# Patient Record
Sex: Female | Born: 1985
Health system: Southern US, Community
[De-identification: ages and names within clinical notes are randomized; demographics above are authoritative.]

## PROBLEM LIST (undated history)

## (undated) DIAGNOSIS — F32A Depression, unspecified: Secondary | ICD-10-CM

## (undated) DIAGNOSIS — N6029 Fibroadenosis of unspecified breast: Secondary | ICD-10-CM

## (undated) DIAGNOSIS — D649 Anemia, unspecified: Secondary | ICD-10-CM

## (undated) DIAGNOSIS — Z8489 Family history of other specified conditions: Secondary | ICD-10-CM

## (undated) DIAGNOSIS — L501 Idiopathic urticaria: Secondary | ICD-10-CM

## (undated) DIAGNOSIS — R030 Elevated blood-pressure reading, without diagnosis of hypertension: Secondary | ICD-10-CM

## (undated) DIAGNOSIS — L509 Urticaria, unspecified: Secondary | ICD-10-CM

## (undated) DIAGNOSIS — F99 Mental disorder, not otherwise specified: Secondary | ICD-10-CM

## (undated) DIAGNOSIS — I10 Essential (primary) hypertension: Secondary | ICD-10-CM

## (undated) HISTORY — PX: NO PAST SURGERIES: SHX2092

## (undated) HISTORY — DX: Mental disorder, not otherwise specified: F99

## (undated) HISTORY — DX: Anemia, unspecified: D64.9

---
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
## 2018-07-31 ENCOUNTER — Other Ambulatory Visit: Payer: Self-pay | Admitting: Family Medicine

## 2018-07-31 DIAGNOSIS — N649 Disorder of breast, unspecified: Secondary | ICD-10-CM | POA: Insufficient documentation

## 2018-07-31 DIAGNOSIS — N6320 Unspecified lump in the left breast, unspecified quadrant: Secondary | ICD-10-CM

## 2018-07-31 HISTORY — DX: Disorder of breast, unspecified: N64.9

## 2018-07-31 LAB — HM PAP SMEAR: HM Pap smear: NEGATIVE

## 2018-07-31 LAB — HM HIV SCREENING LAB: HM HIV Screening: NEGATIVE

## 2019-07-01 DIAGNOSIS — N649 Disorder of breast, unspecified: Secondary | ICD-10-CM

## 2019-07-02 ENCOUNTER — Ambulatory Visit: Payer: Self-pay

## 2019-09-15 ENCOUNTER — Ambulatory Visit: Payer: Medicaid Other

## 2019-10-05 ENCOUNTER — Ambulatory Visit: Payer: Medicaid Other

## 2019-10-21 ENCOUNTER — Ambulatory Visit: Payer: Self-pay | Admitting: Physician Assistant

## 2019-10-21 ENCOUNTER — Other Ambulatory Visit: Payer: Self-pay

## 2019-10-21 DIAGNOSIS — B9689 Other specified bacterial agents as the cause of diseases classified elsewhere: Secondary | ICD-10-CM

## 2019-10-21 DIAGNOSIS — Z113 Encounter for screening for infections with a predominantly sexual mode of transmission: Secondary | ICD-10-CM

## 2019-10-21 DIAGNOSIS — N760 Acute vaginitis: Secondary | ICD-10-CM

## 2019-10-21 LAB — WET PREP FOR TRICH, YEAST, CLUE
Trichomonas Exam: NEGATIVE
Yeast Exam: NEGATIVE

## 2019-10-21 MED ORDER — METRONIDAZOLE 500 MG PO TABS: 2000.0000 mg | ORAL_TABLET | Freq: Once | ORAL | 0 refills | Status: AC

## 2019-10-22 ENCOUNTER — Encounter: Payer: Self-pay | Admitting: Physician Assistant

## 2019-10-22 NOTE — Progress Notes (Signed)
[facility] Department STI clinic/screening visit  Subjective:  Melody Hall is a 34 y.o. female being seen today for an STI screening visit. The patient reports they do have symptoms.  Patient reports that they do not desire a pregnancy in the next year.   They reported they are not interested in discussing contraception today.  No LMP recorded.   Patient has the following medical conditions:   Patient Active Problem List   Diagnosis Date Noted  . Breast lesion 07/31/2018    Chief Complaint  Patient presents with  . SEXUALLY TRANSMITTED DISEASE    HPI  Patient reports that she has noticed a slight odor this morning.  Denies other symptoms.  Using condoms as BCM.  LMP was 09/29/2019 and normal.  See flowsheet for further details and programmatic requirements.    The following portions of the patient's history were reviewed and updated as appropriate: allergies, current medications, past medical history, past social history, past surgical history and problem list.  Objective:  There were no vitals filed for this visit.  Physical Exam Constitutional:      General: She is not in acute distress.    Appearance: Normal appearance.  HENT:     Head: Normocephalic and atraumatic.     Comments: No nits, lice, or hair loss. No cervical, supraclavicular or axillary adenopathy.    Mouth/Throat:     Mouth: Mucous membranes are moist.     Pharynx: Oropharynx is clear. No oropharyngeal exudate or posterior oropharyngeal erythema.  Eyes:     Conjunctiva/sclera: Conjunctivae normal.  Pulmonary:     Effort: Pulmonary effort is normal.  Abdominal:     Palpations: Abdomen is soft. There is no mass.     Tenderness: There is no abdominal tenderness. There is no guarding or rebound.  Genitourinary:    General: Normal vulva.     Rectum: Normal.     Comments: External genitalia/pubic area without nits, lice, edema, erythema, lesions and inguinal adenopathy. Vagina with normal  mucosa and discharge, pH=4.5. Cervix without visible lesions. Uterus firm, mobile, nt, no masses, no CMT, no adnexal tenderness or fullness. Musculoskeletal:     Cervical back: Neck supple. No tenderness.  Skin:    General: Skin is warm and dry.     Findings: No bruising, erythema, lesion or rash.  Neurological:     Mental Status: She is alert and oriented to person, place, and time.  Psychiatric:        Mood and Affect: Mood normal.        Behavior: Behavior normal.        Thought Content: Thought content normal.        Judgment: Judgment normal.      Assessment and Plan:  Melody Hall is a 34 y.o. female presenting to the [facility] Department for STI screening  1. Screening for STD (sexually transmitted disease) Patient into clinic with symptoms. Rec condoms with all sex. Await test results.  Counseled that RN will call if needs to RTC for further treatment once results are back. - WET PREP FOR TRICH, YEAST, CLUE - Gonococcus culture - Chlamydia/Gonorrhea [place] [facility] - HIV [place] [facility] - Syphilis Serology,  Lab  2. BV (bacterial vaginosis) Patient counseled re:  tx for BV but requests alternative treatment that "does not take so long". Will treat with Metronidazole 2 g po with food, no EtOH for 24 hr before and until 72 hr after taking medicine. No sex for 7  days. Rec using OTC antifungal cream if has itching after treatment with antibiotics. - metroNIDAZOLE (FLAGYL) 500 MG tablet; Take 4 tablets (2,000 mg total) by mouth once for 1 dose.  Dispense: 4 tablet; Refill: 0     No follow-ups on file.  No future appointments.  Jerene Dilling, PA

## 2019-10-24 NOTE — Progress Notes (Signed)
Chart reviewed by Pharmacist  Suzanne Walker PharmD, Contract Pharmacist at [facility]  

## 2019-10-25 LAB — GONOCOCCUS CULTURE

## 2019-12-10 ENCOUNTER — Other Ambulatory Visit: Payer: Self-pay

## 2019-12-10 ENCOUNTER — Ambulatory Visit: Payer: Medicaid Other

## 2019-12-14 ENCOUNTER — Other Ambulatory Visit: Payer: Medicaid Other

## 2019-12-31 ENCOUNTER — Ambulatory Visit: Payer: Medicaid Other

## 2020-04-20 ENCOUNTER — Ambulatory Visit (LOCAL_COMMUNITY_HEALTH_CENTER): Payer: Medicaid Other | Admitting: Physician Assistant

## 2020-04-20 ENCOUNTER — Other Ambulatory Visit: Payer: Self-pay

## 2020-04-20 ENCOUNTER — Encounter: Payer: Self-pay | Admitting: Physician Assistant

## 2020-04-20 VITALS — BP 111/79 | Ht 64.0 in | Wt 194.4 lb

## 2020-04-20 DIAGNOSIS — Z1331 Encounter for screening for depression: Secondary | ICD-10-CM | POA: Diagnosis not present

## 2020-04-20 DIAGNOSIS — Z3009 Encounter for other general counseling and advice on contraception: Secondary | ICD-10-CM | POA: Diagnosis not present

## 2020-04-20 DIAGNOSIS — E669 Obesity, unspecified: Secondary | ICD-10-CM | POA: Diagnosis not present

## 2020-04-20 LAB — WET PREP FOR TRICH, YEAST, CLUE
Trichomonas Exam: NEGATIVE
Yeast Exam: NEGATIVE

## 2020-04-20 NOTE — Progress Notes (Signed)
[facility] DEPARTMENT [facility] [address] Main Number: [phone]    Family Planning Visit- Initial Visit  Subjective:  Melody Hall is a 34 y.o.  [id number]   being seen today for an initial well woman visit and to discuss family planning options.  She is currently using condoms, though states did not use with last sex on 04/15/20 for pregnancy prevention. Patient reports she does not want a pregnancy in the next year.  Patient has the following medical conditions has Breast lesion; Obesity without serious comorbidity; and Positive depression screening on their problem list.  Chief Complaint  Patient presents with  . Annual Exam  . Contraception    Patient reports LMP 04/06/20.   Body mass index is 33.37 kg/m. - Patient is eligible for diabetes screening based on BMI and age >54?  no HA1C ordered? not applicable  Patient reports 1 of partners in last year. Desires STI screening?  Yes  Has patient been screened once for HCV in the past?  No  No results found for: HCVAB  Does the patient have current drug use (including MJ), have a partner with drug use, and/or has been incarcerated since last result? No  If yes-- Screen for HCV through [facility]   Does the patient meet criteria for HBV testing? No  Criteria:  -Household, sexual or needle sharing contact with HBV -History of drug use -HIV positive -Those with known Hep C   Health Maintenance Due  Topic Date Due  . Hepatitis C Screening  Never done  . INFLUENZA VACCINE  02/13/2020    Review of Systems  Constitutional: Negative.   HENT: Negative.   Eyes: Negative.   Respiratory: Negative.   Cardiovascular: Negative.   Gastrointestinal: Negative.   Genitourinary: Negative.   Musculoskeletal: Negative.   Skin: Negative.   Neurological: Negative.   Endo/Heme/Allergies: Negative.   Psychiatric/Behavioral: Positive for depression and suicidal ideas. The patient is  nervous/anxious.   Denies current SI/HI, no HI ever. SI "sometimes." Does not have plan, except "take some pills."   The following portions of the patient's history were reviewed and updated as appropriate: allergies, current medications, past family history, past medical history, past social history, past surgical history and problem list. Problem list updated.   See flowsheet for other program required questions.  Objective:   Vitals:   04/20/20 0942  BP: 111/79  Weight: 194 lb 6.4 oz (88.2 kg)  Height: 5\' 4"  (1.626 m)    Physical Exam Exam conducted with a chaperone present.  Constitutional:      Appearance: She is obese.  Cardiovascular:     Rate and Rhythm: Normal rate and regular rhythm.     Pulses: Normal pulses.     Heart sounds: Normal heart sounds.  Pulmonary:     Effort: Pulmonary effort is normal.     Breath sounds: Normal breath sounds.  Genitourinary:    General: Normal vulva.     Exam position: Lithotomy position.     Labia:        Right: No lesion.        Left: No lesion.      Vagina: Vaginal discharge present.     Cervix: No cervical motion tenderness, friability, lesion or erythema.     Uterus: Normal.      Adnexa: Right adnexa normal and left adnexa normal.     Rectum: No external hemorrhoid.     Comments: Vag pH <4.5, mod amt white  creamy discharge without odor Musculoskeletal:        General: Normal range of motion.     Cervical back: Normal range of motion.  Lymphadenopathy:     Cervical: No cervical adenopathy.     Lower Body: No right inguinal adenopathy. No left inguinal adenopathy.  Skin:    General: Skin is warm and dry.  Neurological:     General: No focal deficit present.     Mental Status: She is alert and oriented to person, place, and time.  Psychiatric:        Behavior: Behavior normal.        Thought Content: Thought content normal.        Judgment: Judgment normal.       Assessment and Plan:  Melody Hall is a 34 y.o.  female presenting to the [facility] Department for an initial well woman exam/family planning visit  Contraception counseling: Reviewed all forms of birth control options in the tiered based approach. available including abstinence; over the counter/barrier methods; hormonal contraceptive medication including pill, patch, ring, injection,contraceptive implant, ECP; hormonal and nonhormonal IUDs; permanent sterilization options including vasectomy and the various tubal sterilization modalities. Risks, benefits, and typical effectiveness rates were reviewed.  Questions were answered.  Written information was also given to the patient to review.  Patient desires DMPA, this was not yet prescribed for patient. She will follow up in about 10 days for UPT, and if neg will start DMPA.  She was told to call with any further questions, or with any concerns about this method of contraception.  Emphasized use of condoms 100% of the time for STI prevention.   1. Family planning services Due to recent UPI, rec sexual abstinance til f/u in about 10 days, and then have UPT. If UPT neg then, start DMPA 150 mg IM and repeat every 3 mo for 1 year. Wet prep neg today. Next cervical cancer screen due 08/01/23. - WET PREP FOR TRICH, YEAST, CLUE - HIV [place] [facility] - Chlamydia/Gonorrhea [place] [facility] - Gonococcus culture - Syphilis Serology, [place] [facility]  2. Positive depression screening PHQ-9 = 23 today. Refer for counseling at pt request.  3. Obesity without serious comorbidity, unspecified classification, unspecified obesity type      Return in about 10 days (around 04/30/2020) for UPT, and if neg, DMPA injection.  No future appointments.  Landry Dyke, PA-C

## 2020-04-20 NOTE — Progress Notes (Signed)
Here today for PE and birth control. Last Pap Smear and PE here was 07/31/2018.  Wants all STD screening today. Interested in Depo for birth control. PHQ9 completed and entered in Epic. Tawny Hopping, RN

## 2020-04-25 LAB — GONOCOCCUS CULTURE

## 2020-07-22 ENCOUNTER — Other Ambulatory Visit: Payer: Self-pay

## 2020-07-22 DIAGNOSIS — Z20822 Contact with and (suspected) exposure to covid-19: Secondary | ICD-10-CM

## 2020-07-26 LAB — NOVEL CORONAVIRUS, NAA: SARS-CoV-2, NAA: NOT DETECTED

## 2020-07-31 ENCOUNTER — Encounter: Payer: Self-pay | Admitting: Physician Assistant

## 2020-10-17 ENCOUNTER — Telehealth: Payer: Self-pay | Admitting: Licensed Clinical Social Worker

## 2020-10-17 NOTE — Telephone Encounter (Signed)
Patient left vm requesting a return call.

## 2021-01-08 ENCOUNTER — Ambulatory Visit: Payer: Medicaid Other

## 2021-03-12 ENCOUNTER — Other Ambulatory Visit: Payer: Self-pay

## 2021-03-12 ENCOUNTER — Emergency Department (HOSPITAL_COMMUNITY)
Admission: EM | Admit: 2021-03-12 | Discharge: 2021-03-13 | Disposition: A | Discharge status: Home / Self Care | Payer: BC Managed Care – PPO | Attending: Emergency Medicine | Admitting: Emergency Medicine

## 2021-03-12 ENCOUNTER — Encounter (HOSPITAL_COMMUNITY): Payer: Self-pay

## 2021-03-12 ENCOUNTER — Emergency Department (HOSPITAL_COMMUNITY): Payer: BC Managed Care – PPO

## 2021-03-12 DIAGNOSIS — M791 Myalgia, unspecified site: Secondary | ICD-10-CM | POA: Diagnosis not present

## 2021-03-12 DIAGNOSIS — S4991XA Unspecified injury of right shoulder and upper arm, initial encounter: Secondary | ICD-10-CM

## 2021-03-12 DIAGNOSIS — S40022A Contusion of left upper arm, initial encounter: Secondary | ICD-10-CM | POA: Diagnosis not present

## 2021-03-12 DIAGNOSIS — S40021A Contusion of right upper arm, initial encounter: Secondary | ICD-10-CM | POA: Diagnosis not present

## 2021-03-12 DIAGNOSIS — S4992XA Unspecified injury of left shoulder and upper arm, initial encounter: Secondary | ICD-10-CM
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
## 2021-03-13 MED ORDER — OXYCODONE-ACETAMINOPHEN 5-325 MG PO TABS: 1.0000 | ORAL_TABLET | ORAL | 0 refills | Status: DC | PRN

## 2021-03-13 MED ORDER — OXYCODONE-ACETAMINOPHEN 5-325 MG PO TABS
1.0000 | ORAL_TABLET | Freq: Once | ORAL | Status: AC
  Administered 2021-03-13: 1 via ORAL
  Filled 2021-03-13: qty 1

## 2021-03-13 NOTE — Discharge Instructions (Addendum)
Use alternating warm and cool compresses 3-4 times a day. Continue your Naproxen and take Percocet as prescribed as needed for pain.   Return to the ED with any new or concerning symptoms at any time.
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
## 2021-05-01 ENCOUNTER — Ambulatory Visit: Payer: BC Managed Care – PPO

## 2021-09-07 ENCOUNTER — Ambulatory Visit: Payer: BC Managed Care – PPO

## 2021-09-27 ENCOUNTER — Ambulatory Visit: Payer: BC Managed Care – PPO

## 2021-10-03 ENCOUNTER — Ambulatory Visit (LOCAL_COMMUNITY_HEALTH_CENTER): Payer: BC Managed Care – PPO | Admitting: Family Medicine

## 2021-10-03 ENCOUNTER — Encounter: Payer: Self-pay | Admitting: Family Medicine

## 2021-10-03 ENCOUNTER — Other Ambulatory Visit: Payer: Self-pay

## 2021-10-03 VITALS — BP 134/88 | Ht 64.0 in | Wt 181.6 lb

## 2021-10-03 DIAGNOSIS — Z3200 Encounter for pregnancy test, result unknown: Secondary | ICD-10-CM

## 2021-10-03 DIAGNOSIS — Z3009 Encounter for other general counseling and advice on contraception: Secondary | ICD-10-CM | POA: Diagnosis not present

## 2021-10-03 DIAGNOSIS — Z332 Encounter for elective termination of pregnancy: Secondary | ICD-10-CM

## 2021-10-03 DIAGNOSIS — Z113 Encounter for screening for infections with a predominantly sexual mode of transmission: Secondary | ICD-10-CM

## 2021-10-03 DIAGNOSIS — Z3201 Encounter for pregnancy test, result positive: Secondary | ICD-10-CM

## 2021-10-03 LAB — HM HIV SCREENING LAB: HM HIV Screening: NEGATIVE

## 2021-10-03 LAB — WET PREP FOR TRICH, YEAST, CLUE
Trichomonas Exam: NEGATIVE
Yeast Exam: NEGATIVE

## 2021-10-03 LAB — PREGNANCY, URINE: Preg Test, Ur: POSITIVE — AB

## 2021-10-03 NOTE — Progress Notes (Signed)
? ?  [place] problem visit  ?[place] Department ? ?Subjective:  ?Melody Hall is a 36 y.o. being seen today for  ? ?Chief Complaint  ?Patient presents with  ? Contraception  ?  PE and Depo  ? ? ?HPI ? ? ?Does the patient have a current or past history of drug use? Yes  ? No components found for: HCV] ? ? ?Health Maintenance Due  ?Topic Date Due  ? Hepatitis C Screening  Never done  ? COVID-19 Vaccine (3 - Booster for Moderna series) 03/15/2020  ? INFLUENZA VACCINE  02/12/2021  ? ? ?ROS ? ?The following portions of the patient's history were reviewed and updated as appropriate: allergies, current medications, past family history, past medical history, past social history, past surgical history and problem list. Problem list updated. ? ? ?See flowsheet for other program required questions. ? ?Objective:  ? ?Vitals:  ? 10/03/21 1436  ?BP: 134/88  ?Weight: 181 lb 9.6 oz (82.4 kg)  ?Height: 5\' 4"  (1.626 m)  ? ? ?Physical Exam ?Constitutional:   ?   Appearance: Normal appearance.  ?Skin: ?   General: Skin is warm and dry.  ?Neurological:  ?   Mental Status: She is alert and oriented to person, place, and time.  ?Psychiatric:     ?   Mood and Affect: Mood normal.     ?   Behavior: Behavior normal.  ? ? ? ? ?Assessment and Plan:  ?Melody Hall is a 36 y.o. female presenting to the [facility] Department for a Women's Health problem visit ? ?1. Screening examination for venereal disease ?Patient accepted all screenings including wet prep, vaginal CT/GC and bloodwork for HIV/RPR.  ?Patient meets criteria for HepB screening? Yes. Ordered? No - declined  ?Patient meets criteria for HepC screening? Yes. Ordered? No - declined  ? ?Wet prep results neg    ?Treatment needed  ?Discussed time line for [facility] Lab results and that patient will be called with positive results and encouraged patient to call if she had not heard in 2 weeks.  ?Counseled to return or seek care for continued  or worsening symptoms ?Recommended condom use with all sex ? ?Patient is currently using  no BCM  to prevent pregnancy.   ?- Chlamydia/Gonorrhea [place] [facility] ?- HIV [place] [facility] ?- WET PREP FOR TRICH, YEAST, CLUE ?- Syphilis Serology, [place] [facility] ? ?2. Encounter for pregnancy test, result unknown ?PT positive  ?- Pregnancy, urine ? ?3. Termination of pregnancy (fetus) ?Pt reports termination of pregnancy on 08/27/2021, PT today still showing positive, beta Hcg for levels. Will contact pt with results and follow up steps  ?- Beta hCG quant (ref lab) ? ? ? ? ?No follow-ups on file. ? ?No future appointments. ? ?Junious Dresser, FNP ? ?

## 2021-10-03 NOTE — Progress Notes (Signed)
Pt here for PE and Depo.  Patient notified of positive pregnancy test.  Beta hCG ordered.  Provider will notify pt of results and determine plan of care, at that time.  Windle Guard, RN ? ?

## 2021-10-04 LAB — BETA HCG QUANT (REF LAB): hCG Quant: 60 m[IU]/mL
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
## 2021-10-25 ENCOUNTER — Ambulatory Visit: Payer: BC Managed Care – PPO

## 2021-10-26 ENCOUNTER — Encounter: Payer: Self-pay | Admitting: Advanced Practice Midwife

## 2021-10-26 ENCOUNTER — Ambulatory Visit (LOCAL_COMMUNITY_HEALTH_CENTER): Payer: BC Managed Care – PPO | Admitting: Advanced Practice Midwife

## 2021-10-26 VITALS — BP 128/87 | HR 78 | Temp 97.1°F | Ht 64.0 in | Wt 186.6 lb

## 2021-10-26 DIAGNOSIS — Z30013 Encounter for initial prescription of injectable contraceptive: Secondary | ICD-10-CM

## 2021-10-26 DIAGNOSIS — Z3202 Encounter for pregnancy test, result negative: Secondary | ICD-10-CM

## 2021-10-26 DIAGNOSIS — Z3009 Encounter for other general counseling and advice on contraception: Secondary | ICD-10-CM | POA: Diagnosis not present

## 2021-10-26 LAB — PREGNANCY, URINE: Preg Test, Ur: NEGATIVE

## 2021-10-26 MED ORDER — MEDROXYPROGESTERONE ACETATE 150 MG/ML IM SUSP
150.0000 mg | Freq: Once | INTRAMUSCULAR | Status: AC
  Administered 2021-10-26: 150 mg via INTRAMUSCULAR

## 2021-10-26 NOTE — Progress Notes (Signed)
Provider aware of urine pregnancy result prior to Dep injection. Alesia Banda RN ?

## 2021-10-26 NOTE — Progress Notes (Signed)
Patient her for repeat pregnancy testing and to discuss BCM. Consented for Depo Provera and provider viewed in house result of negative urine pregnancy test. Counseling given, patient instructed to schedule appointment for Physical Exam in 1 week and  no sexual intercourse for 7 days. Patient verbalized understanding. Alesia Banda RN ?

## 2021-10-26 NOTE — Progress Notes (Signed)
[facility] DEPARTMENT ?Family Planning Clinic ?[address] [employer] ?Main Number: [phone] ? ?Contraception/Family Planning VISIT ENCOUNTER NOTE ? ?Subjective:  ? Melody Hall is a 36 y.o. SBF [id number] female here for reproductive life counseling. The patient is currently using No Method - Other Reason to prevent pregnancy.  Pt came in 10/03/21 for an STD screen and wanting DMPA initiation. EAB 08/27/21 and at 10/03/21 apt had +PT and Bhcg=60.  Last sex 10/12/21 without condom; with current partner x 3 years; 1 partner in last 3 mo. Wants DMPA initiation today. Last PE 04/20/20. No menses since EAB. Last pap 07/31/18 neg HPV neg ?Denies cigs, vaping, cigars. Last MJ last night. Last ETOH last night (2 glasses wine) 2x/wk ? ?The patient does not want a pregnancy in the next year.   ? ?Client states they are looking for the following:  High efficacy at preventing pregnancy ? ?Denies abnormal vaginal bleeding, discharge, pelvic pain, problems with intercourse or other gynecologic concerns.  ?  ?Gynecologic History ?Patient's last menstrual period was 06/27/2021 (approximate). ? ?Health Maintenance Due  ?Topic Date Due  ? Hepatitis C Screening  Never done  ? COVID-19 Vaccine (3 - Booster for Moderna series) 03/15/2020  ? ? ? ?The following portions of the patient's history were reviewed and updated as appropriate: allergies, current medications, past family history, past medical history, past social history, past surgical history and problem list. ? ?Review of Systems ?Pertinent items are noted in HPI. ?  ?Objective:  ?BP 128/87   Pulse 78   Temp (!) 97.1 ?F (36.2 ?C)   Ht 5\' 4"  (1.626 m)   Wt 186 lb 9.6 oz (84.6 kg)   LMP 06/27/2021 (Approximate) Comment: Abortion March 13th and no period since  BMI 32.03 kg/m?  ?Gen: well appearing, NAD ?HEENT: no scleral icterus ?CV: RR ?Lung: Normal WOB ?Ext: warm well perfused ? ? ? ?Assessment and Plan:  ? ?Need for emergency contraceptive care was  assessed today. ? ?Last unprotected sex was:  10/12/21 without condom; with current partner x 3 years; 1 partner in last 3 mo  ? ?Patient reported > 120 hours .  Reviewed options and patient desired No method of ECP, declined all   ? ?Contraception counseling: Reviewed methods in a patient centered fashion and used shared decision making with the patient. Utilized [employer] patient education tools as appropriate. ?The patient stated there goals and desires from a method are: High efficacy at preventing pregnancy ? ?We reviewed the following methods in detail based on patient preferences available included: ?Hormonal Injection ? ?Patient expressed they would like Hormonal Injection ? ?This was provided to the patient today.  if not why not clearly documented ? ?Risks, benefits, and typical effectiveness rates were reviewed.  Questions were answered.  Written information was also given to the patient to review.   ? ? ?  will follow up in  11-13 weeks for surveillance.   was told to call with any further questions, or with any concerns about this method of contraception or cycle control.  Emphasized use of condoms 100% of the time for STI prevention. ? ? ?1. Family planning ?Pt needs physical asap--please assist pt with scheduling. ?Last pap 07/31/18 neg HPV neg ?PT neg today--please give pt DMPA 150 mg today and counseled abstinance/back up condoms next 7 days ?- Beta hCG quant (ref lab) ?- Pregnancy, urine ?  ? ?Please refer to After Visit Summary for other counseling recommendations.  ? ?No follow-ups on file. ? ?  Alberteen Spindle, CNM ?[facility] DEPARTMENT ?

## 2021-10-27 LAB — BETA HCG QUANT (REF LAB): hCG Quant: 9 m[IU]/mL

## 2021-11-02 ENCOUNTER — Ambulatory Visit: Payer: BC Managed Care – PPO

## 2022-01-11 ENCOUNTER — Ambulatory Visit (LOCAL_COMMUNITY_HEALTH_CENTER): Payer: Medicaid Other

## 2022-01-11 VITALS — BP 124/89 | Ht 64.0 in | Wt 182.5 lb

## 2022-01-11 DIAGNOSIS — Z3009 Encounter for other general counseling and advice on contraception: Secondary | ICD-10-CM

## 2022-01-11 DIAGNOSIS — Z3042 Encounter for surveillance of injectable contraceptive: Secondary | ICD-10-CM

## 2022-01-11 MED ORDER — MEDROXYPROGESTERONE ACETATE 150 MG/ML IM SUSP
150.0000 mg | Freq: Once | INTRAMUSCULAR | Status: AC
  Administered 2022-01-11: 150 mg via INTRAMUSCULAR

## 2022-01-11 NOTE — Progress Notes (Signed)
11 weeks post depo.  Last annual exam 04/20/20 and was told at 10/26/21 appt to call in one week and schedule PE but pt did not.  Had a one time order for depo when given 10/26/21.  States started her period she thinks 12/27/21 and continues today.  Consulted A. White FNP and said ok to administer depo one time today and pt needs to schedule annual PE.  Depo given IM RUOQ and pt tolerated well.  Next depo due 03/29/22 and appt reminder given.    Pt counseled to monitor her BP.  Accompanied pt to clerk to schedule PE appt; 01/23/22 10 am. Cherlynn Polo, RN

## 2022-01-11 NOTE — Progress Notes (Signed)
Consulted by RN re: patient situation.  Reviewed RN note and agree that it reflects our discussion and my recommendations. Lakin Romer, FNP  

## 2022-01-23 ENCOUNTER — Ambulatory Visit: Payer: Medicaid Other

## 2022-02-05 ENCOUNTER — Ambulatory Visit: Payer: Medicaid Other

## 2022-02-15 ENCOUNTER — Ambulatory Visit (LOCAL_COMMUNITY_HEALTH_CENTER): Payer: Medicaid Other | Admitting: Nurse Practitioner

## 2022-02-15 ENCOUNTER — Encounter: Payer: Self-pay | Admitting: Nurse Practitioner

## 2022-02-15 VITALS — BP 142/101

## 2022-02-15 DIAGNOSIS — F32A Depression, unspecified: Secondary | ICD-10-CM

## 2022-02-15 DIAGNOSIS — Z3042 Encounter for surveillance of injectable contraceptive: Secondary | ICD-10-CM

## 2022-02-15 DIAGNOSIS — N6459 Other signs and symptoms in breast: Secondary | ICD-10-CM

## 2022-02-15 DIAGNOSIS — Z30013 Encounter for initial prescription of injectable contraceptive: Secondary | ICD-10-CM | POA: Diagnosis not present

## 2022-02-15 DIAGNOSIS — Z113 Encounter for screening for infections with a predominantly sexual mode of transmission: Secondary | ICD-10-CM

## 2022-02-15 DIAGNOSIS — Z3009 Encounter for other general counseling and advice on contraception: Secondary | ICD-10-CM | POA: Diagnosis not present

## 2022-02-15 DIAGNOSIS — Z01419 Encounter for gynecological examination (general) (routine) without abnormal findings: Secondary | ICD-10-CM

## 2022-02-15 LAB — WET PREP FOR TRICH, YEAST, CLUE
Trichomonas Exam: NEGATIVE
Yeast Exam: NEGATIVE

## 2022-02-15 LAB — HM HEPATITIS C SCREENING LAB: HM Hepatitis Screen: NEGATIVE

## 2022-02-15 LAB — HM HIV SCREENING LAB: HM HIV Screening: NEGATIVE

## 2022-02-15 LAB — HEPATITIS B SURFACE ANTIGEN: Hepatitis B Surface Ag: NONREACTIVE

## 2022-02-15 MED ORDER — MEDROXYPROGESTERONE ACETATE 150 MG/ML IM SUSP
150.0000 mg | INTRAMUSCULAR | Status: DC
  Administered 2022-04-10: 150 mg via INTRAMUSCULAR

## 2022-02-15 NOTE — Progress Notes (Signed)
Patient seen for STD screening, PE and BCM refill. Condoms declined. Family packet given.Wet prep reviewed, no tx per standing orders.

## 2022-02-15 NOTE — Progress Notes (Addendum)
[facility] Main Number: [phone]    Family Planning Visit- Initial Visit  Subjective:  Melody Hall is a 36 y.o.  [id number]   being seen today for an initial annual visit and to discuss reproductive life planning.  The patient is currently using Hormonal Injection for pregnancy prevention. Patient reports   does not want a pregnancy in the next year.     report they are looking for a method that provides High efficacy at preventing pregnancy  Patient has the following medical conditions has Breast lesion; Obesity without serious comorbidity BMI=32.0; and Positive depression screening on their problem list.  Chief Complaint  Patient presents with   Contraception    Physical and STD screening     Patient reports to clinic today for a physical and STD screening.  Patient would like to continue with Depo.      There is no height or weight on file to calculate BMI. - Patient is eligible for diabetes screening based on BMI and age [id number]?  not applicable [id number] ordered? not applicable  Patient reports 1  partner/s in last year. Desires STI screening?  Yes  Has patient been screened once for HCV in the past?  No  No results found for: "HCVAB"  Does the patient have current drug use (including MJ), have a partner with drug use, and/or has been incarcerated since last result? Yes  If yes-- Screen for HCV through [facility]   Does the patient meet criteria for HBV testing? Yes  Criteria:  -Household, sexual or needle sharing contact with HBV -History of drug use -HIV positive -Those with known Hep C   Health Maintenance Due  Topic Date Due   Hepatitis C Screening  Never done   COVID-19 Vaccine (3 - Moderna series) 03/15/2020   INFLUENZA VACCINE  02/12/2022    Review of Systems  Constitutional:  Negative for chills, fever, malaise/fatigue and weight loss.  HENT:  Negative for congestion, hearing  loss and sore throat.   Eyes:  Negative for blurred vision, double vision and photophobia.  Respiratory:  Positive for shortness of breath.   Cardiovascular:  Negative for chest pain.  Gastrointestinal:  Negative for abdominal pain, blood in stool, constipation, diarrhea, heartburn, nausea and vomiting.  Genitourinary:  Negative for dysuria and frequency.  Musculoskeletal:  Negative for back pain, joint pain and neck pain.  Skin:  Negative for itching and rash.  Neurological:  Negative for dizziness, weakness and headaches.  Endo/Heme/Allergies:  Does not bruise/bleed easily.  Psychiatric/Behavioral:  Negative for depression, substance abuse and suicidal ideas.     The following portions of the patient's history were reviewed and updated as appropriate: allergies, current medications, past family history, past medical history, past social history, past surgical history and problem list. Problem list updated.   See flowsheet for other program required questions.  Objective:   Vitals:   02/15/22 0930 02/15/22 1001  BP: (!) 139/97 (!) 142/101    Physical Exam Constitutional:      Appearance: Normal appearance.  HENT:     Head: Normocephalic. No abrasion, masses or laceration. Hair is normal.     Jaw: No tenderness or swelling.     Right Ear: External ear normal.     Left Ear: External ear normal.     Nose: Nose normal.     Mouth/Throat:     Lips: Pink. No lesions.     Mouth: Mucous membranes are  moist. No lacerations or oral lesions.     Dentition: No dental caries.     Tongue: No lesions.     Palate: No mass and lesions.     Pharynx: No pharyngeal swelling, oropharyngeal exudate, posterior oropharyngeal erythema or uvula swelling.     Tonsils: No tonsillar exudate or tonsillar abscesses.  Eyes:     Pupils: Pupils are equal, round, and reactive to light.  Neck:     Thyroid: No thyroid mass, thyromegaly or thyroid tenderness.  Cardiovascular:     Rate and Rhythm: Normal  rate and regular rhythm.  Pulmonary:     Effort: Pulmonary effort is normal.     Breath sounds: Normal breath sounds.  Chest:  Breasts:    Right: Normal. No swelling, mass, nipple discharge, skin change or tenderness.     Left: Normal. No swelling, mass, nipple discharge, skin change or tenderness.       Comments: 0.5 x 0.5 cm non tender, movable mass noted to right breast located at 12 o'clock.   Abdominal:     General: Abdomen is flat. Bowel sounds are normal.     Palpations: Abdomen is soft.     Tenderness: There is no abdominal tenderness. There is no rebound.  Genitourinary:    Pubic Area: No rash or pubic lice.      Labia:        Right: No rash, tenderness or lesion.        Left: No rash, tenderness or lesion.      Vagina: Normal. No vaginal discharge, erythema, tenderness or lesions.     Cervix: No cervical motion tenderness, discharge, lesion or erythema.     Uterus: Normal.      Adnexa:        Right: No tenderness.         Left: No tenderness.       Rectum: Normal.     Comments: Amount Discharge: small  Odor: No pH: less than 4.5 Adheres to vaginal wall: No Color: color of discharge matches the Demetrious Rainford swab  Musculoskeletal:     Cervical back: Full passive range of motion without pain and normal range of motion.  Lymphadenopathy:     Cervical: No cervical adenopathy.     Right cervical: No superficial, deep or posterior cervical adenopathy.    Left cervical: No superficial, deep or posterior cervical adenopathy.     Upper Body:     Right upper body: No supraclavicular, axillary or epitrochlear adenopathy.     Left upper body: No supraclavicular, axillary or epitrochlear adenopathy.     Lower Body: No right inguinal adenopathy. No left inguinal adenopathy.  Skin:    General: Skin is warm and dry.     Findings: No erythema, laceration, lesion or rash.  Neurological:     Mental Status: She is alert and oriented to person, place, and time.  Psychiatric:         Attention and Perception: Attention normal.        Mood and Affect: Mood normal.        Speech: Speech normal.        Behavior: Behavior normal. Behavior is cooperative.       Assessment and Plan:  Melody Hall is a 36 y.o. female presenting to the [facility] Department for an initial annual wellness/contraceptive visit  Contraception counseling: Reviewed options based on patient desire and reproductive life plan. Patient is interested in Hormonal Injection. This was provided to the patient  today.   Risks, benefits, and typical effectiveness rates were reviewed.  Questions were answered.  Written information was also given to the patient to review.    The patient will follow up in  11 weeks for surveillance and Depo.  The patient was told to call with any further questions, or with any concerns about this method of contraception.  Emphasized use of condoms 100% of the time for STI prevention.  Need for ECP was assessed. ECP not offered due to continuous use of a birth control method.  1. Family planning counseling -36 year old female in clinic today for a physical and STD screening. -ROS reviewed, patient states that as the seasons change she begins to experience some heaviness in her chest.  No known history of asthma or allergies.  Advised to take allergy medication especially as the seasons have changed and to follow up with a PCP.  No other complaints. -Patient desires to continue with Depo. -PHQ9=13, denies thoughts of self harm.  Patient reports a history of depression.  Agrees to counseling services, referral submitted.    2. Well woman exam with routine gynecological exam -Well woman exam today. -CBE today.  0.5 x 0.5 cm non tender, movable mass noted to right breast located at 12 o'clock.  Referral submitted to [facility] for evaluation.   -PAP due 07/2023  3. Screening examination for venereal disease -STD screening today. -Patient accepted all screenings including  oral and rectal GC, vaginal CT/GC, wet prep and bloodwork for HIV/RPR.  Patient meets criteria for HepB screening? Yes. Ordered? Yes Patient meets criteria for HepC screening? Yes. Ordered? Yes  Treat wet prep per standing order Discussed time line for [facility] results and that patient will be called with positive results and encouraged patient to call if she had not heard in 2 weeks.  Counseled to return or seek care for continued or worsening symptoms Recommended condom use with all sex  Patient is currently using Hormonal Contraception: Injection, Rings and Patches to prevent pregnancy.    - HIV/HCV [facility] - Syphilis Serology, [place] [facility] - HBV Antigen/Antibody [facility] - Chlamydia/Gonorrhea [facility] - Gonococcus culture - Gonococcus culture - WET PREP FOR TRICH, YEAST, CLUE  4. Depression, unspecified depression type -History of depression.  Counseling referral submitted.  - Ambulatory referral to Behavioral Health  5. Surveillance for Depo-Provera contraception -May have Depo 150 MG IM q11-13 weeks x 1 year.   - medroxyPROGESTERone (DEPO-PROVERA) injection 150 mg   Total time spent: 30 minutes   Return in about 11 weeks (around 05/03/2022) for Routine DMPA injection.    Glenna Fellows, FNP

## 2022-02-20 LAB — GONOCOCCUS CULTURE

## 2022-02-20 NOTE — Addendum Note (Signed)
Addended by: Glenna Fellows on: 02/20/2022 10:55 PM   Modules accepted: Orders

## 2022-02-21 ENCOUNTER — Other Ambulatory Visit: Payer: Self-pay | Admitting: Nurse Practitioner

## 2022-02-21 DIAGNOSIS — N6459 Other signs and symptoms in breast: Secondary | ICD-10-CM

## 2022-03-29 ENCOUNTER — Ambulatory Visit: Payer: Medicaid Other

## 2022-04-10 ENCOUNTER — Ambulatory Visit (LOCAL_COMMUNITY_HEALTH_CENTER): Payer: Medicaid Other

## 2022-04-10 VITALS — BP 134/88 | Ht 64.0 in | Wt 182.0 lb

## 2022-04-10 DIAGNOSIS — Z3042 Encounter for surveillance of injectable contraceptive: Secondary | ICD-10-CM

## 2022-04-10 DIAGNOSIS — Z3009 Encounter for other general counseling and advice on contraception: Secondary | ICD-10-CM

## 2022-04-10 NOTE — Progress Notes (Signed)
12 weeks 5 days post depo. Voices no concerns. Depo given today per order by Collene Leyden, FNP dated 02/15/2022. Tolerated well LUOQ. Next depo due 06/26/2022, has reminder. Josie Saunders, RN

## 2022-04-29 ENCOUNTER — Telehealth: Payer: Self-pay | Admitting: Licensed Clinical Social Worker

## 2022-04-29 NOTE — Telephone Encounter (Signed)
Pt. Lvm for LCSW on 04/26/22 requesting a call back. LCSW attempted call to pt. And lvm.

## 2022-05-01 ENCOUNTER — Telehealth: Payer: Self-pay | Admitting: Licensed Clinical Social Worker

## 2022-05-01 NOTE — Telephone Encounter (Signed)
Patient lvm for LCSW.  

## 2022-05-20 ENCOUNTER — Other Ambulatory Visit: Payer: BC Managed Care – PPO

## 2022-06-04 ENCOUNTER — Other Ambulatory Visit: Payer: Self-pay

## 2022-06-21 ENCOUNTER — Ambulatory Visit
Admission: RE | Admit: 2022-06-21 | Discharge: 2022-06-21 | Disposition: A | Discharge status: Home / Self Care | Payer: Medicaid Other | Source: Ambulatory Visit | Attending: Nurse Practitioner | Admitting: Nurse Practitioner

## 2022-06-21 DIAGNOSIS — N6459 Other signs and symptoms in breast: Secondary | ICD-10-CM

## 2022-06-24 ENCOUNTER — Other Ambulatory Visit: Payer: Self-pay | Admitting: Nurse Practitioner

## 2022-06-24 ENCOUNTER — Encounter: Payer: Self-pay | Admitting: Nurse Practitioner

## 2022-06-24 DIAGNOSIS — R928 Other abnormal and inconclusive findings on diagnostic imaging of breast: Secondary | ICD-10-CM

## 2022-06-24 DIAGNOSIS — N63 Unspecified lump in unspecified breast: Secondary | ICD-10-CM

## 2022-07-01 ENCOUNTER — Ambulatory Visit: Payer: Medicaid Other

## 2022-07-10 ENCOUNTER — Other Ambulatory Visit: Payer: Self-pay | Admitting: Nurse Practitioner

## 2022-07-10 DIAGNOSIS — N63 Unspecified lump in unspecified breast: Secondary | ICD-10-CM

## 2022-07-10 DIAGNOSIS — R921 Mammographic calcification found on diagnostic imaging of breast: Secondary | ICD-10-CM

## 2022-07-10 DIAGNOSIS — R928 Other abnormal and inconclusive findings on diagnostic imaging of breast: Secondary | ICD-10-CM

## 2022-07-11 ENCOUNTER — Inpatient Hospital Stay: Admission: RE | Admit: 2022-07-11 | Payer: Medicaid Other | Source: Ambulatory Visit

## 2022-07-19 ENCOUNTER — Ambulatory Visit: Payer: Medicaid Other

## 2022-07-30 ENCOUNTER — Inpatient Hospital Stay: Admission: RE | Admit: 2022-07-30 | Payer: Medicaid Other | Source: Ambulatory Visit

## 2022-08-09 ENCOUNTER — Ambulatory Visit: Payer: Medicaid Other

## 2022-08-14 ENCOUNTER — Ambulatory Visit
Admission: RE | Admit: 2022-08-14 | Discharge: 2022-08-14 | Disposition: A | Discharge status: Home / Self Care | Payer: Medicaid Other | Source: Ambulatory Visit | Attending: Nurse Practitioner | Admitting: Nurse Practitioner

## 2022-08-14 DIAGNOSIS — R928 Other abnormal and inconclusive findings on diagnostic imaging of breast: Secondary | ICD-10-CM | POA: Insufficient documentation

## 2022-08-14 DIAGNOSIS — N63 Unspecified lump in unspecified breast: Secondary | ICD-10-CM | POA: Insufficient documentation

## 2022-08-14 HISTORY — PX: BREAST BIOPSY: SHX20

## 2022-08-14 MED ORDER — LIDOCAINE-EPINEPHRINE 1 %-1:100000 IJ SOLN
10.0000 mL | Freq: Once | INTRAMUSCULAR | Status: AC
  Administered 2022-08-14: 10 mL
  Filled 2022-08-14: qty 10

## 2022-08-14 MED ORDER — LIDOCAINE HCL (PF) 1 % IJ SOLN
2.0000 mL | Freq: Once | INTRAMUSCULAR | Status: AC
  Administered 2022-08-14: 2 mL
  Filled 2022-08-14: qty 2

## 2022-08-14 MED ORDER — LIDOCAINE HCL (PF) 1 % IJ SOLN
15.0000 mL | Freq: Once | INTRAMUSCULAR | Status: AC
  Administered 2022-08-14: 15 mL
  Filled 2022-08-14: qty 15

## 2022-08-14 MED ORDER — LIDOCAINE-EPINEPHRINE 1 %-1:100000 IJ SOLN
8.0000 mL | Freq: Once | INTRAMUSCULAR | Status: AC
  Administered 2022-08-14: 8 mL
  Filled 2022-08-14: qty 8

## 2022-08-15 LAB — SURGICAL PATHOLOGY

## 2022-08-20 ENCOUNTER — Other Ambulatory Visit: Payer: Self-pay | Admitting: Nurse Practitioner

## 2022-08-20 DIAGNOSIS — N63 Unspecified lump in unspecified breast: Secondary | ICD-10-CM

## 2022-08-20 DIAGNOSIS — R928 Other abnormal and inconclusive findings on diagnostic imaging of breast: Secondary | ICD-10-CM

## 2022-08-22 ENCOUNTER — Telehealth: Payer: Self-pay

## 2022-08-22 NOTE — Telephone Encounter (Signed)
No additional notes

## 2022-08-23 ENCOUNTER — Other Ambulatory Visit: Payer: Self-pay | Admitting: Hematology and Oncology

## 2022-08-23 DIAGNOSIS — R928 Other abnormal and inconclusive findings on diagnostic imaging of breast: Secondary | ICD-10-CM

## 2022-08-23 DIAGNOSIS — N63 Unspecified lump in unspecified breast: Secondary | ICD-10-CM

## 2022-08-23 DIAGNOSIS — R921 Mammographic calcification found on diagnostic imaging of breast: Secondary | ICD-10-CM

## 2022-08-23 DIAGNOSIS — N6320 Unspecified lump in the left breast, unspecified quadrant: Secondary | ICD-10-CM

## 2022-08-26 ENCOUNTER — Ambulatory Visit (LOCAL_COMMUNITY_HEALTH_CENTER): Payer: Medicaid Other | Admitting: Family Medicine

## 2022-08-26 ENCOUNTER — Encounter: Payer: Self-pay | Admitting: Family Medicine

## 2022-08-26 ENCOUNTER — Ambulatory Visit: Payer: Medicaid Other | Attending: Hematology and Oncology | Admitting: Hematology and Oncology

## 2022-08-26 VITALS — BP 128/92 | Ht 64.0 in | Wt 188.8 lb

## 2022-08-26 VITALS — BP 154/111 | Wt 187.7 lb

## 2022-08-26 DIAGNOSIS — Z309 Encounter for contraceptive management, unspecified: Secondary | ICD-10-CM | POA: Diagnosis not present

## 2022-08-26 DIAGNOSIS — Z3042 Encounter for surveillance of injectable contraceptive: Secondary | ICD-10-CM | POA: Diagnosis not present

## 2022-08-26 DIAGNOSIS — N6320 Unspecified lump in the left breast, unspecified quadrant: Secondary | ICD-10-CM

## 2022-08-26 DIAGNOSIS — Z308 Encounter for other contraceptive management: Secondary | ICD-10-CM

## 2022-08-26 DIAGNOSIS — Z113 Encounter for screening for infections with a predominantly sexual mode of transmission: Secondary | ICD-10-CM

## 2022-08-26 DIAGNOSIS — N6310 Unspecified lump in the right breast, unspecified quadrant: Secondary | ICD-10-CM

## 2022-08-26 DIAGNOSIS — R030 Elevated blood-pressure reading, without diagnosis of hypertension: Secondary | ICD-10-CM

## 2022-08-26 LAB — WET PREP FOR TRICH, YEAST, CLUE
Trichomonas Exam: NEGATIVE
Yeast Exam: NEGATIVE

## 2022-08-26 LAB — HM HIV SCREENING LAB: HM HIV Screening: NEGATIVE

## 2022-08-26 MED ORDER — MEDROXYPROGESTERONE ACETATE 150 MG/ML IM SUSP
150.0000 mg | INTRAMUSCULAR | Status: DC
  Administered 2022-08-26 – 2022-11-13 (×2): 150 mg via INTRAMUSCULAR

## 2022-08-26 NOTE — Progress Notes (Signed)
[name] is a 37 y.o. female who presents to [facility] clinic today with complaint of right and left breast.    Pap Smear: Pap not smear completed today. Last Pap smear was 2020 at [place] [facility] and was normal. Per patient has no history of an abnormal Pap smear. Last Pap smear result is available in Epic.   Physical exam: Breasts Breasts symmetrical. No skin abnormalities bilateral breasts. No nipple retraction bilateral breasts. No nipple discharge bilateral breasts. No lymphadenopathy. Right breast with mass noted at 1 o'clock and left breast with masses noted at 10 o'clock and 1 o'clock. MM LT BREAST BX W LOC DEV 1ST LESION IMAGE BX SPEC STEREO GUIDE  Addendum Date: 08/19/2022   ADDENDUM REPORT: 08/19/2022 09:52 ADDENDUM: PATHOLOGY revealed: Site A. BREAST, LEFT AT 3:00, 4 CM FROM THE NIPPLE; ULTRASOUND-GUIDED CORE NEEDLE BIOPSY:- FRAGMENTS OF BENIGN FIBROADENOMA, WITH ASSOCIATED DYSTROPHIC CALCIFICATIONS.- NEGATIVE FOR ATYPICAL PROLIFERATIVE BREAST DISEASE. Pathology results are CONCORDANT with imaging findings, per Dr. Lillia Mountain. PATHOLOGY revealed: Site B. BREAST, LEFT UPPER INNER QUADRANT; STEREOTACTIC CORE NEEDLE BIOPSY:- FLAT EPITHELIAL ATYPIA.- BACKGROUND CHANGES SUGGESTIVE OF COMPLEX SCLEROSING LESION/RADIAL SCAR, WITH ASSOCIATED COLUMNAR CELL CHANGE. - NEGATIVE FOR CARCINOMA IN SITU AND MALIGNANCY. Comment: Immunohistochemical studies directed against myoepithelial markers calponin and p63 were performed on multiple tissue sections. These studies demonstrate intact myoepithelial marking. There is no evidence of invasive carcinoma. Pathology results are CONCORDANT with imaging findings, per Dr. Lillia Mountain. Pathology results and recommendations below were discussed with patient by telephone on 08/16/2022. Patient reported biopsy site within normal limits with slight tenderness at the site. Post biopsy care instructions were reviewed, questions were answered and my direct phone number was  provided to patient. Patient was instructed to call [facility] if any concerns or questions arise related to the biopsy. RECOMMENDATIONS: 1. Please schedule patient for two additional ultrasound guided biopsies of the mass in the 9 o'clock region of the left breast and the mass in the 1 o'clock region of the right breast. 2. Surgical consultation for consideration of excision for Site B only. Request for surgical consultation relayed to Casper Harrison RN at [facility] by Electa Sniff RN on 08/16/2022. Please schedule surgical consult for after these two biopsies performed. 3. Six month follow up ultrasound for the additional lesions in both breast is recommended. Pathology results reported by Electa Sniff RN on 08/16/2022. Electronically Signed   By: Lillia Mountain M.D.   On: 08/19/2022 09:52   Result Date: 08/19/2022 CLINICAL DATA:  Left breast distortion. EXAM: LEFT BREAST STEREOTACTIC CORE NEEDLE BIOPSY COMPARISON:  Previous exam(s). FINDINGS: The patient and I discussed the procedure of stereotactic-guided biopsy including benefits and alternatives. We discussed the high likelihood of a successful procedure. We discussed the risks of the procedure including infection, bleeding, tissue injury, clip migration, and inadequate sampling. Informed written consent was given. The usual time out protocol was performed immediately prior to the procedure. Using sterile technique and 1% lidocaine and 1% lidocaine with epinephrine as local anesthetic, under stereotactic guidance, a 9 gauge vacuum assisted device was used to perform core needle biopsy of distortion in the upper-inner quadrant of the left breast using a medial to lateral approach. Lesion quadrant: Upper inner quadrant At the conclusion of the procedure, shaped tissue marker clip was deployed into the biopsy cavity. Follow-up 2-view mammogram was performed and dictated separately. IMPRESSION: Stereotactic-guided biopsy of coil the left breast.  No apparent complications. Electronically Signed: By: Lillia Mountain M.D. On:  08/14/2022 13:46   MM CLIP PLACEMENT LEFT  Result Date: 08/14/2022 CLINICAL DATA:  Status post ultrasound-guided core biopsy of a mass in the 3 o'clock region of the left breast and stereotactic biopsy of distortion in the upper-inner quadrant of the left breast. EXAM: 3D DIAGNOSTIC LEFT MAMMOGRAM POST ULTRASOUND AND STEREOTACTIC BIOPSIES COMPARISON:  Previous exam(s). FINDINGS: 3D Mammographic images were obtained following stereotactic and ultrasound-guided core biopsies of the left breast. There is a heart shaped clip in the mass in the 3 o'clock region of the left breast and a coil shaped clip in the distortion in the upper inner quadrant of the left breast. IMPRESSION: Appropriate positioning of the heart shaped clip in the 3 o'clock region of the left breast and coil shaped clip in the upper-inner quadrant of the left breast. Final Assessment: Post Procedure Mammograms for Marker Placement Electronically Signed   By: Lillia Mountain M.D.   On: 08/14/2022 14:04  MM DIAG BREAST TOMO BILATERAL  Result Date: 06/21/2022 CLINICAL DATA:  Palpable mass right breast. EXAM: DIGITAL DIAGNOSTIC BILATERAL MAMMOGRAM WITH TOMOSYNTHESIS; ULTRASOUND LEFT BREAST LIMITED; ULTRASOUND RIGHT BREAST LIMITED TECHNIQUE: Bilateral digital diagnostic mammography and breast tomosynthesis was performed.; Targeted ultrasound examination of the left breast was performed.; Targeted ultrasound examination of the right breast was performed COMPARISON:  None. ACR Breast Density Category b: There are scattered areas of fibroglandular density. FINDINGS: Cc and MLO views of bilateral breasts, spot tangential right breast are submitted. There is a mass at the palpable area right breast 12 o'clock. A second mass is noted in the upper-outer quadrant right breast. There is a third mass in the medial slight lower right breast. Images of the left breast demonstrate  architectural distortion in the medial probably 3 o'clock left breast. There is a mass in the subareolar lower inner quadrant left breast. There is a mass in the posterior lower inner quadrant left breast. There is a mass in the upper-outer quadrant left breast. At least 2 central mid depth subareolar masses are identified. Targeted ultrasound is performed, showing a 3.1 x 1.8 x 2.9 cm oval macrolobulated hypoechoic mass at right breast 1 o'clock 8 cm nipple. At the right breast 2 o'clock 10 cm from nipple, there is a 2.4 x 0.9 x 1.6 cm oval hypoechoic mass. At right breast 10 o'clock 10 cm nipple, there is a 3.8 x 2.4 x 3.8 cm macro lobulated hypoechoic mass. These correlate to the mammographic masses. Ultrasound of the right axilla is negative. Ultrasound of the left breast demonstrate a 4.4 x 1.3 x 2.9 cm oval heterogeneous hypoechoic lesion at the left breast 9 o'clock 3 cm from nipple correlating to the mass in the subareolar lower inner quadrant left breast. There are no focal discrete lesion identified that would correlate to the architectural distortion seen mammogram in the medial left breast. At the left breast 3 o'clock 4 cm from nipple, there is slight lobulated mixed echotexture measuring 0.8 x 0.5 x 0.6 cm. At the left breast 1 o'clock 2 cm from nipple, there is a 0.9 x 0.5 x 0.8 cm oval hypoechoic mass. At the left breast 8 o'clock subareolar region, there is 1.1 x 0.6 x 1 cm oval hypoechoic mass. Ultrasound of the left axilla is negative. IMPRESSION: Highly suspicious findings. RECOMMENDATION: Recommend stereotactic core biopsy of the architectural distortion in the medial left breast. Also recommend ultrasound-guided core biopsy of the left breast 3 o'clock mass. If either one of these biopsies is positive for cancer, consider ultrasound-guided core  biopsy of the 4 cm mass at left breast 9 o'clock and the largest mass in right breast at 1 o'clock. I have discussed the findings and recommendations  with the patient. If applicable, a reminder letter will be sent to the patient regarding the next appointment. BI-RADS CATEGORY  5: Highly suggestive of malignancy. Electronically Signed   By: Abelardo Diesel M.D.   On: 06/21/2022 16:42       Pelvic/Bimanual Pap is not indicated today    Smoking History: Patient has never smoked and was not referred to quit line.    Patient Navigation: Patient education provided. Access to services provided for patient through [facility] program. No interpreter provided. No transportation provided   Colorectal Cancer Screening: Per patient has never had colonoscopy completed No complaints today.    Breast and Cervical Cancer Risk Assessment: Patient does not have family history of breast cancer, known genetic mutations, or radiation treatment to the chest before age 24. Patient does not have history of cervical dysplasia, immunocompromised, or DES exposure in-utero.  Risk Assessment   No risk assessment data     A: BCCCP exam without pap smear Complaint of right and left breast masses. Will have biopsies per [facility].  P: Referred [facility] to the [facility] for a diagnostic mammogram. Appointment scheduled 08/26/22.  Melodye Ped, NP 08/26/2022 10:39 AM

## 2022-08-26 NOTE — Progress Notes (Signed)
Pt is here for Depo injection and STD screening.  Wet mount results reviewed, no treatment required.  Depo 150 mg given IM in RUOQ.  Pt tolerated well.  Pt given reminder card to return in 11-13 weeks for next Depo. Windle Guard, RN

## 2022-08-26 NOTE — Progress Notes (Signed)
[facility] Department  STI clinic/screening visit [place] [address] [phone]  Subjective:  Melody Hall is a 37 y.o. female being seen today for an STI screening visit. The patient reports they do not have symptoms.  Patient reports that they do not desire a pregnancy in the next year.   They reported they are not interested in discussing contraception today.    Patient's last menstrual period was 07/26/2022.  Patient has the following medical conditions:   Patient Active Problem List   Diagnosis Date Noted   Obesity without serious comorbidity BMI=32.0 04/20/2020   Positive depression screening 04/20/2020   Breast lesion 07/31/2018    Chief Complaint  Patient presents with   Contraception    Late depo and std screening     HPI  Patient reports to clinic for late depo   Does the patient using douching products? Yes  Last HIV test per patient/review of record was  Lab Results  Component Value Date   HMHIVSCREEN Negative - Validated 02/15/2022    Patient reports last pap was 07/31/2018  Screening for MPX risk: Does the patient have an unexplained rash? No Is the patient MSM? No Does the patient endorse multiple sex partners or anonymous sex partners? No Did the patient have close or sexual contact with a person diagnosed with MPX? No Has the patient traveled outside the [place] where MPX is endemic? No Is there a high clinical suspicion for MPX-- evidenced by one of the following No  -Unlikely to be chickenpox  -Lymphadenopathy  -Rash that present in same phase of evolution on any given body part See flowsheet for further details and programmatic requirements.   Immunization history:  Immunization History  Administered Date(s) Administered   DTP 11/10/1990, 05/08/1992   DTaP 07/28/2014   Hepatitis B 04/26/1998, 05/31/1998, 10/04/1998, 05/03/1999, 06/14/1999, 10/15/1999   Influenza, Seasonal, Injecte, Preservative Fre  05/28/2005   Influenza-Unspecified 04/19/2020   MMR 05/08/1992, 12/20/2019   Moderna Sars-Covid-2 Vaccination 12/20/2019, 01/19/2020   OPV 11/10/1990, 05/08/1992   Tdap 04/02/2013   Varicella 12/20/2019, 01/25/2020     The following portions of the patient's history were reviewed and updated as appropriate: allergies, current medications, past medical history, past social history, past surgical history and problem list.  Objective:   Vitals:   08/26/22 1530  BP: (!) 128/92  Weight: 188 lb 12.8 oz (85.6 kg)  Height: 5' 4"$  (1.626 m)    Physical Exam Vitals and nursing note reviewed.  Constitutional:      Appearance: Normal appearance.  HENT:     Head: Normocephalic and atraumatic.     Mouth/Throat:     Mouth: Mucous membranes are moist.     Pharynx: Oropharynx is clear. No oropharyngeal exudate or posterior oropharyngeal erythema.  Pulmonary:     Effort: Pulmonary effort is normal.  Abdominal:     General: Abdomen is flat.     Palpations: There is no mass.     Tenderness: There is no abdominal tenderness. There is no rebound.  Genitourinary:    Comments: Declined genital exam- self swabbed Musculoskeletal:        General: Normal range of motion.  Lymphadenopathy:     Head:     Right side of head: No preauricular or posterior auricular adenopathy.     Left side of head: No preauricular or posterior auricular adenopathy.     Cervical: No cervical adenopathy.     Upper Body:     Right upper  body: No supraclavicular, axillary or epitrochlear adenopathy.     Left upper body: No supraclavicular, axillary or epitrochlear adenopathy.  Skin:    General: Skin is warm and dry.     Findings: No rash.  Neurological:     General: No focal deficit present.     Mental Status: She is alert and oriented to person, place, and time.  Psychiatric:        Mood and Affect: Mood normal.        Behavior: Behavior normal.    Assessment and Plan:  Melody Hall is a 37 y.o. female  presenting to the [facility] Department for STI screening  1. Screening for venereal disease  - Chlamydia/Gonorrhea [place] [facility] [facility] - HIV [place] [facility] - Syphilis Serology, [place] [facility] - WET PREP FOR [place], YEAST, CLUE  2. Encounter for Depo-Provera contraception -BP elevated- encouraged patient to go to PCP for elevated BP- list given for PCPs  - medroxyPROGESTERone (DEPO-PROVERA) injection 150 mg  Patient accepted all screenings including  vaginal CT/GC and bloodwork for HIV/RPR, and wet prep. Patient meets criteria for HepB screening? Yes. Ordered? No- negative on 02/2022, immune Patient meets criteria for HepC screening? Yes. Ordered? No- negative 02/2022  Treat wet prep per standing order Discussed time line for [facility] results and that patient will be called with positive results and encouraged patient to call if she had not heard in 2 weeks.  Counseled to return or seek care for continued or worsening symptoms Recommended repeat testing in 3 months with positive results. Recommended condom use with all sex  Patient is currently using Hormonal Contraception: Injection, Rings and Patches to prevent pregnancy.    No follow-ups on file.  Future Appointments  Date Time Provider [place]  08/28/2022 12:00 PM [place] MM GV-4 [facility]  08/28/2022 12:00 PM [place] MM GV[facility]3 [facility]  08/28/2022  1:00 PM [facility] GV[facility]BI[facility]SY [facility]  08/28/2022  1:45 PM [facility] MM GV-BIOPSY [facility] [place], FNP

## 2022-08-26 NOTE — Patient Instructions (Signed)
Taught Melody Hall about breast self awareness and gave educational materials to take home. Patient did not need a Pap smear today due to last Pap smear was in 2023 per patient. Let her know [facility] will cover Pap smears every 5 years unless has a history of abnormal Pap smears. [facility]. Appointment to be scheduled. Patient aware of appointment and will be there. Let patient know will follow up with her within the next couple weeks with results. Melody Hall verbalized understanding.  Melodye Ped, NP 10:47 AM

## 2022-08-28 ENCOUNTER — Ambulatory Visit
Admission: RE | Admit: 2022-08-28 | Discharge: 2022-08-28 | Disposition: A | Discharge status: Home / Self Care | Payer: Medicaid Other | Source: Ambulatory Visit | Attending: Hematology and Oncology | Admitting: Hematology and Oncology

## 2022-08-28 DIAGNOSIS — N63 Unspecified lump in unspecified breast: Secondary | ICD-10-CM

## 2022-08-28 DIAGNOSIS — R928 Other abnormal and inconclusive findings on diagnostic imaging of breast: Secondary | ICD-10-CM

## 2022-08-28 HISTORY — PX: BREAST BIOPSY: SHX20

## 2022-08-28 MED ORDER — LIDOCAINE-EPINEPHRINE 1 %-1:100000 IJ SOLN
4.0000 mL | Freq: Once | INTRAMUSCULAR | Status: AC
  Administered 2022-08-28: 4 mL via INTRADERMAL

## 2022-08-28 MED ORDER — LIDOCAINE HCL (PF) 1 % IJ SOLN
6.0000 mL | Freq: Once | INTRAMUSCULAR | Status: AC
  Administered 2022-08-28: 6 mL via INTRADERMAL

## 2022-08-30 ENCOUNTER — Encounter: Payer: Self-pay | Admitting: *Deleted

## 2022-08-30 LAB — SURGICAL PATHOLOGY

## 2022-08-30 NOTE — Progress Notes (Signed)
Referral recieved from [facility] for benign breast mass. Attempted to call patient, no answer, VM left asking for return call.

## 2022-09-02 ENCOUNTER — Encounter: Payer: Self-pay | Admitting: *Deleted

## 2022-09-19 ENCOUNTER — Encounter: Payer: Self-pay | Admitting: *Deleted

## 2022-09-19 DIAGNOSIS — N6489 Other specified disorders of breast: Secondary | ICD-10-CM

## 2022-09-19 NOTE — Progress Notes (Signed)
Spoke with Melody Hall and she would like to see Dr. Christian Mate.   Referral has been sent to [facility] and their office will call her with the appointment.

## 2022-10-03 ENCOUNTER — Ambulatory Visit: Payer: Medicaid Other | Admitting: Surgery

## 2022-10-08 ENCOUNTER — Ambulatory Visit: Payer: Medicaid Other | Admitting: Surgery

## 2022-10-09 NOTE — Progress Notes (Signed)
Patient ID: Melody Hall, female   DOB: [birth year], 37 y.o.   MRN: [id number]  Chief Complaint:  FEA/radial scar, left breast.   History of Present Illness Melody Hall is a 37 y.o. 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  She feels it has grown somewhat larger though that is not why she is here today.  She has had recent stereotactic biopsy of an area of distortion, and ultrasound-guided biopsies of the most concerning of masses. She is currently taking birth control.  She has no family history of breast cancer.  She began menstruating at the age of 62.  She is gravida 2 para 2.  She is 19 with her first pregnancy.  She did not breast-feed.  She has known palpable breast lumps, most notably upper right breast.  No history of nipple discharge or dermal changes.  She denies any history of breast pain.  Past Medical History Past Medical History:  Diagnosis Date   Anemia    2016   Breast lesion 07/31/2018   Mental disorder    Depression      Past Surgical History:  Procedure Laterality Date   BREAST BIOPSY Left 08/14/2022   Stereo Bx coil clip - path pending   BREAST BIOPSY Left 08/14/2022   [place] LT BREAST BX W LOC DEV 1ST LESION IMG BX SPEC US GUIDE 08/14/2022 [facility]   BREAST BIOPSY Left 08/14/2022   MM LT BREAST BX W LOC DEV 1ST LESION IMAGE BX SPEC STEREO GUIDE 08/14/2022 [facility]   BREAST BIOPSY Right 08/28/2022   u/s bx, 1:00 RIBBON clip-path pending   BREAST BIOPSY Left 08/28/2022   u/s bx, 9:00  RIBBON clip-path pending   BREAST BIOPSY Left 08/28/2022   [place] LT BREAST BX W LOC DEV 1ST LESION IMG BX SPEC US GUIDE 08/28/2022 [facility]   BREAST BIOPSY Right 08/28/2022   [place] RT BREAST BX W LOC DEV 1ST LESION IMG BX SPEC US GUIDE 08/28/2022 [facility]   NO PAST SURGERIES      No Active Allergies   Current Outpatient Medications  Medication Sig Dispense Refill    Adapalene-Benzoyl Peroxide 0.3-2.5 % GEL APPLY SMALL AMOUNT AT BEDTIME TO FACE     ciprofloxacin (CIPRO) 500 MG tablet Take 1 tablet every 12 hours by oral route for 3 days.     Current Facility-Administered Medications  Medication Dose Route Frequency Provider Last Rate Last Admin   medroxyPROGESTERone (DEPO-PROVERA) injection 150 mg  150 mg Intramuscular Q90 days Lenice Llamas, FNP   150 mg at 08/26/22 1600    Family History Family History  Problem Relation Age of Onset   Healthy Paternal Grandfather    Healthy Paternal Grandmother    Healthy Maternal Grandmother    Healthy Maternal Grandfather    Healthy Father    Healthy Mother       Social History Social History   Tobacco Use   Smoking status: Never    Passive exposure: Never   Smokeless tobacco: Never  Vaping Use   Vaping Use: Never used  Substance Use Topics   Alcohol use: Yes    Alcohol/week: 2.0 standard drinks of alcohol    Types: 2 Glasses of wine per week    Comment: last use 10/25/21 2x/wk   Drug use: Yes    Types: Marijuana    Comment: last use 10/25/21        Review of Systems  Constitutional:  Negative.   HENT: Negative.    Eyes: Negative.   Respiratory: Negative.    Cardiovascular: Negative.   Gastrointestinal: Negative.   Genitourinary: Negative.   Skin: Negative.   Neurological: Negative.   Psychiatric/Behavioral: Negative.       Physical Exam Blood pressure (!) 135/96, pulse 98, temperature 98 F (36.7 C), height 5\' 4"  (1.626 m), weight 191 lb (86.6 kg), last menstrual period 09/02/2022, SpO2 100 %. Last Weight  Most recent update: 10/10/2022 10:48 AM    Weight  86.6 kg (191 lb)             CONSTITUTIONAL: Well developed, and nourished, appropriately responsive and aware without distress.   EYES: Sclera non-icteric.   EARS, NOSE, MOUTH AND THROAT:  The oropharynx is clear. Oral mucosa is pink and moist.     Hearing is intact to voice.  NECK: Trachea is midline, and there is  no jugular venous distension.  LYMPH NODES:  Lymph nodes in the neck are not appreciated. RESPIRATORY:  Lungs are clear, and breath sounds are equal bilaterally.   Normal respiratory effort without pathologic use of accessory muscles. CARDIOVASCULAR: Heart is regular in rate and rhythm.   Well perfused.  GI: The abdomen is  soft, nontender, and nondistended.  GU: [name] is present as chaperone.  There is a very large prominent right upper breast mass that is visible from a distance.  There are other smaller masses in both breasts consistent with benign fibroadenomas. MUSCULOSKELETAL:  Symmetrical muscle tone appreciated in all four extremities.    SKIN: Skin turgor is normal. No pathologic skin lesions appreciated.  NEUROLOGIC:  Motor and sensation appear grossly normal.  Cranial nerves are grossly without defect. PSYCH:  Alert and oriented to person, place and time. Affect is appropriate for situation.  Data Reviewed I have personally reviewed what is currently available of the patient's imaging, recent labs and medical records.   Labs:      No data to display             No data to display         SURGICAL PATHOLOGY  CASE: [id number]  PATIENT: Melody Hall  Surgical Pathology Report   Specimen Submitted:  A. Breast, right  B. Breast, left   Clinical History: 37 YO F with 1) 3.1 cm upper inner right breast mass -  1:00 position, 2) 4.4 cm inner left breast mass - 9:00 position. A -  Ribbon-shaped clip placed following ultrasound guided biopsy of RIGHT  breast at 1 o'clock. B - Ribbon-shaped clip placed following ultrasound  guided biopsy of LEFT breast at 9 o'clock.   DIAGNOSIS:  A. BREAST, RIGHT AT 1:00, 8 CM FROM THE NIPPLE; ULTRASOUND-GUIDED CORE  NEEDLE BIOPSY:  - FRAGMENTS OF BENIGN FIBROADENOMA.  - NEGATIVE FOR ATYPICAL PROLIFERATIVE BREAST DISEASE.   B. BREAST, LEFT AT 9:00, 3 CM FROM THE NIPPLE; ULTRASOUND-GUIDED CORE  NEEDLE BIOPSY:  - FRAGMENTS OF  BENIGN FIBROADENOMA, WITH FOCAL USUAL DUCTAL HYPERPLASIA.  - NEGATIVE FOR ATYPICAL PROLIFERATIVE BREAST DISEASE.    SURGICAL PATHOLOGY  CASE: [id number]  PATIENT: Melody Hall  Surgical Pathology Report   Specimen Submitted:  A. Breast, left, 3:00  B. Breast, left UIQ   Clinical History: Mass.  R/O fibroadenoma vs IMC.  Distortion.  R/O IMC.  A - Heart-shaped clip placed following ultrasound guided biopsy of LEFT  breast at 3 o'clock. B - Coil-shaped clip placed following stereotactic  biopsy of LEFT breast, upper inner quadrant.  DIAGNOSIS:  A. BREAST, LEFT AT 3:00, 4 CM FROM THE NIPPLE; ULTRASOUND-GUIDED CORE  NEEDLE BIOPSY:  - FRAGMENTS OF BENIGN FIBROADENOMA, WITH ASSOCIATED DYSTROPHIC  CALCIFICATIONS.  - NEGATIVE FOR ATYPICAL PROLIFERATIVE BREAST DISEASE.   B. BREAST, LEFT UPPER INNER QUADRANT; STEREOTACTIC CORE NEEDLE BIOPSY:  - FLAT EPITHELIAL ATYPIA.  - BACKGROUND CHANGES SUGGESTIVE OF COMPLEX SCLEROSING LESION/RADIAL  SCAR, WITH ASSOCIATED COLUMNAR CELL CHANGE.  - NEGATIVE FOR CARCINOMA IN SITU AND MALIGNANCY.   Imaging: Radiological images reviewed:   CLINICAL DATA:  Palpable mass right breast.   EXAM: DIGITAL DIAGNOSTIC BILATERAL MAMMOGRAM WITH TOMOSYNTHESIS; ULTRASOUND LEFT BREAST LIMITED; ULTRASOUND RIGHT BREAST LIMITED   TECHNIQUE: Bilateral digital diagnostic mammography and breast tomosynthesis was performed.; Targeted ultrasound examination of the left breast was performed.; Targeted ultrasound examination of the right breast was performed   COMPARISON:  None.   ACR Breast Density Category b: There are scattered areas of fibroglandular density.   FINDINGS: Cc and MLO views of bilateral breasts, spot tangential right breast are submitted. There is a mass at the palpable area right breast 12 o'clock. A second mass is noted in the upper-outer quadrant right breast. There is a third mass in the medial slight lower right breast.   Images of  the left breast demonstrate architectural distortion in the medial probably 3 o'clock left breast. There is a mass in the subareolar lower inner quadrant left breast. There is a mass in the posterior lower inner quadrant left breast. There is a mass in the upper-outer quadrant left breast. At least 2 central mid depth subareolar masses are identified.   Targeted ultrasound is performed, showing a 3.1 x 1.8 x 2.9 cm oval macrolobulated hypoechoic mass at right breast 1 o'clock 8 cm nipple. At the right breast 2 o'clock 10 cm from nipple, there is a 2.4 x 0.9 x 1.6 cm oval hypoechoic mass. At right breast 10 o'clock 10 cm nipple, there is a 3.8 x 2.4 x 3.8 cm macro lobulated hypoechoic mass. These correlate to the mammographic masses. Ultrasound of the right axilla is negative.   Ultrasound of the left breast demonstrate a 4.4 x 1.3 x 2.9 cm oval heterogeneous hypoechoic lesion at the left breast 9 o'clock 3 cm from nipple correlating to the mass in the subareolar lower inner quadrant left breast. There are no focal discrete lesion identified that would correlate to the architectural distortion seen mammogram in the medial left breast. At the left breast 3 o'clock 4 cm from nipple, there is slight lobulated mixed echotexture measuring 0.8 x 0.5 x 0.6 cm. At the left breast 1 o'clock 2 cm from nipple, there is a 0.9 x 0.5 x 0.8 cm oval hypoechoic mass. At the left breast 8 o'clock subareolar region, there is 1.1 x 0.6 x 1 cm oval hypoechoic mass. Ultrasound of the left axilla is negative.   IMPRESSION: Highly suspicious findings.   RECOMMENDATION: Recommend stereotactic core biopsy of the architectural distortion in the medial left breast. Also recommend ultrasound-guided core biopsy of the left breast 3 o'clock mass. If either one of these biopsies is positive for cancer, consider ultrasound-guided core biopsy of the 4 cm mass at left breast 9 o'clock and the largest mass in right  breast at 1 o'clock.   I have discussed the findings and recommendations with the patient. If applicable, a reminder letter will be sent to the patient regarding the next appointment.   BI-RADS CATEGORY  5: Highly suggestive of malignancy.     Electronically Signed  By: Sherian Rein M.D.   On: 06/21/2022 16:42  CLINICAL DATA:  Palpable mass right breast.   EXAM: DIGITAL DIAGNOSTIC BILATERAL MAMMOGRAM WITH TOMOSYNTHESIS; ULTRASOUND LEFT BREAST LIMITED; ULTRASOUND RIGHT BREAST LIMITED   TECHNIQUE: Bilateral digital diagnostic mammography and breast tomosynthesis was performed.; Targeted ultrasound examination of the left breast was performed.; Targeted ultrasound examination of the right breast was performed   COMPARISON:  None.   ACR Breast Density Category b: There are scattered areas of fibroglandular density.   FINDINGS: Cc and MLO views of bilateral breasts, spot tangential right breast are submitted. There is a mass at the palpable area right breast 12 o'clock. A second mass is noted in the upper-outer quadrant right breast. There is a third mass in the medial slight lower right breast.   Images of the left breast demonstrate architectural distortion in the medial probably 3 o'clock left breast. There is a mass in the subareolar lower inner quadrant left breast. There is a mass in the posterior lower inner quadrant left breast. There is a mass in the upper-outer quadrant left breast. At least 2 central mid depth subareolar masses are identified.   Targeted ultrasound is performed, showing a 3.1 x 1.8 x 2.9 cm oval macrolobulated hypoechoic mass at right breast 1 o'clock 8 cm nipple. At the right breast 2 o'clock 10 cm from nipple, there is a 2.4 x 0.9 x 1.6 cm oval hypoechoic mass. At right breast 10 o'clock 10 cm nipple, there is a 3.8 x 2.4 x 3.8 cm macro lobulated hypoechoic mass. These correlate to the mammographic masses. Ultrasound of the right axilla is  negative.   Ultrasound of the left breast demonstrate a 4.4 x 1.3 x 2.9 cm oval heterogeneous hypoechoic lesion at the left breast 9 o'clock 3 cm from nipple correlating to the mass in the subareolar lower inner quadrant left breast. There are no focal discrete lesion identified that would correlate to the architectural distortion seen mammogram in the medial left breast. At the left breast 3 o'clock 4 cm from nipple, there is slight lobulated mixed echotexture measuring 0.8 x 0.5 x 0.6 cm. At the left breast 1 o'clock 2 cm from nipple, there is a 0.9 x 0.5 x 0.8 cm oval hypoechoic mass. At the left breast 8 o'clock subareolar region, there is 1.1 x 0.6 x 1 cm oval hypoechoic mass. Ultrasound of the left axilla is negative.   IMPRESSION: Highly suspicious findings.   RECOMMENDATION: Recommend stereotactic core biopsy of the architectural distortion in the medial left breast. Also recommend ultrasound-guided core biopsy of the left breast 3 o'clock mass. If either one of these biopsies is positive for cancer, consider ultrasound-guided core biopsy of the 4 cm mass at left breast 9 o'clock and the largest mass in right breast at 1 o'clock.   I have discussed the findings and recommendations with the patient. If applicable, a reminder letter will be sent to the patient regarding the next appointment.   BI-RADS CATEGORY  5: Highly suggestive of malignancy.     Electronically Signed   By: Sherian Rein M.D.   On: 06/21/2022 16:42 Within last 24 hrs: No results found.  Assessment    Radial scar/complex sclerosing lesion, flat epithelial atypia, upper inner quadrant left breast. Benign fibroadenomas. Patient Active Problem List   Diagnosis Date Noted   Elevated blood pressure reading 08/26/2022   Obesity without serious comorbidity BMI=32.0 04/20/2020   Positive depression screening 04/20/2020   Breast lesion 07/31/2018  Plan    Tag localization of the left breast  lesion, with excisional biopsy of send same.  Possible right breast lumpectomy.  Risks of proceeding with breast biopsy discussed in detail with patient these include but not limited to anesthesia, bleeding, infection, need for additional procedures, need for follow-up imaging regardless of pathology.  She understands that if she should desire we could proceed with lumpectomy from the right breast as it is quite prominent.  Otherwise her questions have been adequately answered, no guarantees were ever expressed or implied.  Face-to-face time spent with the patient and accompanying care providers(if present) was 40 minutes, with more than 50% of the time spent counseling, educating, and coordinating care of the patient.    These notes generated with voice recognition software. I apologize for typographical errors.  Campbell Lerner M.D., FACS 10/11/2022, 4:35 PM

## 2022-10-10 ENCOUNTER — Ambulatory Visit: Payer: BLUE CROSS/BLUE SHIELD | Admitting: Surgery

## 2022-10-10 ENCOUNTER — Other Ambulatory Visit: Payer: Self-pay

## 2022-10-10 ENCOUNTER — Encounter: Payer: Self-pay | Admitting: Surgery

## 2022-10-10 VITALS — BP 135/96 | HR 98 | Temp 98.0°F | Ht 64.0 in | Wt 191.0 lb

## 2022-10-10 DIAGNOSIS — N6489 Other specified disorders of breast: Secondary | ICD-10-CM

## 2022-10-10 DIAGNOSIS — D241 Benign neoplasm of right breast: Secondary | ICD-10-CM

## 2022-10-10 DIAGNOSIS — D242 Benign neoplasm of left breast: Secondary | ICD-10-CM

## 2022-10-10 NOTE — Patient Instructions (Signed)
We have spoken today about removing a lump in your breast. This will be done by Dr. Christian Mate at [facility].  What is radio frequency localization of the breast?(RFID) RFID tag localization uses radiofrequency technology to accurately pinpoint the tumor. Seeing exactly where the tumor is before surgery helps surgeons more effectively remove the entire tumor and spare surrounding healthy breast tissue.  You will most likely be able to leave the hospital several hours after your surgery. Rarely, a patient needs to stay over night but this is a possibility.  Plan to tenatively be off work for 1 week following the surgery and may return with approximately 2 more weeks of a lifting restriction, no greater than 15 lbs.  Please see your Blue surgery sheet for more information. Our surgery scheduler will call you to look at surgery dates, let you know when your RFID tag will be placed, and to go over information.     Lumpectomy A lumpectomy is a form of "breast conserving" or "breast preservation" surgery. It may also be referred to as a partial mastectomy. During a lumpectomy, the portion of the breast that contains the cancerous tumor or breast mass (the lump) is removed. Some normal tissue around the lump may also be removed to make sure all of the tumor has been removed.  LET [facility] CARE PROVIDER KNOW ABOUT: Any allergies you have. All medicines you are taking, including vitamins, herbs, eye drops, creams, and over-the-counter medicines. Previous problems you or members of your family have had with the use of anesthetics. Any blood disorders you have. Previous surgeries you have had. Medical conditions you have. RISKS AND COMPLICATIONS Generally, this is a safe procedure. However, problems can occur and include: Bleeding. Infection. Pain. Temporary swelling. Change in the shape of the breast, particularly if a large portion is removed. BEFORE THE PROCEDURE Ask your health care provider about  changing or stopping your regular medicines. This is especially important if you are taking diabetes medicines or blood thinners. Do not eat or drink anything after midnight on the night before the procedure or as directed by your health care provider. Ask your health care provider if you can take a sip of water with any approved medicines. On the day of surgery, your health care provider will use a mammogram or ultrasound to locate and mark the tumor in your breast. These markings on your breast will show where the cut (incision) will be made. PROCEDURE  An IV tube will be put into one of your veins. You may be given medicine to help you relax before the surgery (sedative). You will be given one of the following: A medicine that numbs the area (local anesthetic). A medicine that makes you fall asleep (general anesthetic). Your health care provider will use a kind of electric scalpel that uses heat to minimize bleeding (electrocautery knife). A curved incision (like a smile or frown) that follows the natural curve of your breast is made, to allow for minimal scarring and better healing. The tumor will be removed with some of the surrounding tissue. This will be sent to the lab for analysis. Your health care provider may also remove your lymph nodes at this time if needed. Sometimes, but not always, a rubber tube called a drain will be surgically inserted into your breast area or armpit to collect excess fluid that may accumulate in the space where the tumor was. This drain is connected to a plastic bulb on the outside of your body. This drain  creates suction to help remove the fluid. The incisions will be closed with stitches (sutures). A bandage may be placed over the incisions. AFTER THE PROCEDURE You will be taken to the recovery area. You will be given medicine for pain. A small rubber drain may be placed in the breast for 2-3 days to prevent a collection of blood (hematoma) from developing in  the breast. You will be given instructions on caring for the drain before you go home. A pressure bandage (dressing) will be applied for 1-2 days to prevent bleeding. Ask your health care provider how to care for your bandage at home.   This information is not intended to replace advice given to you by your health care provider. Make sure you discuss any questions you have with your health care provider.   Document Released: 08/12/2006 Document Revised: 07/22/2014 Document Reviewed: 12/04/2012 Elsevier Interactive Patient Education [employer].

## 2022-10-11 ENCOUNTER — Ambulatory Visit: Payer: Self-pay | Admitting: Surgery

## 2022-10-11 ENCOUNTER — Other Ambulatory Visit: Payer: Self-pay | Admitting: Surgery

## 2022-10-11 DIAGNOSIS — N6489 Other specified disorders of breast: Secondary | ICD-10-CM

## 2022-10-11 DIAGNOSIS — R928 Other abnormal and inconclusive findings on diagnostic imaging of breast: Secondary | ICD-10-CM

## 2022-10-11 DIAGNOSIS — D241 Benign neoplasm of right breast: Secondary | ICD-10-CM | POA: Insufficient documentation

## 2022-10-21 ENCOUNTER — Telehealth: Payer: Self-pay | Admitting: Surgery

## 2022-10-21 NOTE — Telephone Encounter (Signed)
Several messages have been left for patient to call.  Please inform her of the following regarding scheduled surgery with Dr. Claudine Mouton.   Pre-Admission date/time, and Surgery date at [facility].  Surgery Date: 11/11/22 Preadmission Testing Date: 11/01/22 (phone 8a-1p)  Also patient will need to call at [phone], between 1-3:00pm the day before surgery, to find out what time to arrive for surgery.    Also remind patient of her RF tag to be placed at [facility] on 10/30/22.

## 2022-10-21 NOTE — Telephone Encounter (Signed)
Called patient back, she will need to check with family and/or friends to see if they will be able to bring her for 4/29.  Patient will call back to confirm.

## 2022-10-23 ENCOUNTER — Telehealth: Payer: Self-pay

## 2022-10-23 NOTE — Telephone Encounter (Signed)
Attempted to call patient regarding transportation to surgical appointment on 11/11/2022. Left message on voicemail requesting a return call, on 10/21/2022 voicemail was full.

## 2022-10-23 NOTE — Telephone Encounter (Signed)
Updated information regarding rescheduled surgery at patient's request.  Left message to call, please inform her of the following:   Surgery Date: 11/15/22 Preadmission Testing Date: 11/07/22 (phone 8a-1p)  Also patient will need to call at [phone], between 1-3:00pm the day before surgery, to find out what time to arrive for surgery.    Remind patient of her RF tag to be placed at [facility] on 10/30/22, strongly encouraged patient to keep this appt.

## 2022-10-23 NOTE — Telephone Encounter (Signed)
Patient now wants to reschedule surgery.  Another message is left for her to call me back so that we can look at dates for rescheduling.

## 2022-10-25 NOTE — Telephone Encounter (Signed)
Another message is left for patient to call me so that updated surgery info can be provided to her.

## 2022-10-28 NOTE — Telephone Encounter (Signed)
Patient calls back, she is now informed of all dates regarding her surgery.   

## 2022-10-28 NOTE — Telephone Encounter (Signed)
Another message left for patient to call so that surgery information can be provided to her.

## 2022-10-30 ENCOUNTER — Ambulatory Visit
Admission: RE | Admit: 2022-10-30 | Discharge: 2022-10-30 | Disposition: A | Discharge status: Home / Self Care | Payer: BLUE CROSS/BLUE SHIELD | Source: Ambulatory Visit | Attending: Surgery | Admitting: Surgery

## 2022-10-30 DIAGNOSIS — N6489 Other specified disorders of breast: Secondary | ICD-10-CM | POA: Diagnosis present

## 2022-10-30 HISTORY — PX: BREAST LUMPECTOMY WITH RADIOFREQUENCY TAG IDENTIFICATION: SHX6884

## 2022-10-30 MED ORDER — LIDOCAINE HCL (PF) 1 % IJ SOLN
10.0000 mL | Freq: Once | INTRAMUSCULAR | Status: AC
  Administered 2022-10-30: 10 mL

## 2022-11-01 ENCOUNTER — Other Ambulatory Visit: Payer: BLUE CROSS/BLUE SHIELD

## 2022-11-07 ENCOUNTER — Inpatient Hospital Stay
Admission: RE | Admit: 2022-11-07 | Discharge: 2022-11-07 | Disposition: A | Discharge status: Home / Self Care | Payer: BLUE CROSS/BLUE SHIELD | Source: Ambulatory Visit

## 2022-11-07 HISTORY — DX: Elevated blood-pressure reading, without diagnosis of hypertension: R03.0

## 2022-11-07 HISTORY — DX: Depression, unspecified: F32.A

## 2022-11-07 NOTE — Pre-Procedure Instructions (Signed)
Called pt x 2 and left messages both times for pt to call us back. I have moved pts phone call to tomorrow (11-08-22) and informed pt of this via message

## 2022-11-08 ENCOUNTER — Encounter
Admission: RE | Admit: 2022-11-08 | Discharge: 2022-11-08 | Disposition: A | Discharge status: Home / Self Care | Payer: BLUE CROSS/BLUE SHIELD | Source: Ambulatory Visit | Attending: Surgery | Admitting: Surgery

## 2022-11-08 ENCOUNTER — Encounter (HOSPITAL_COMMUNITY): Payer: Self-pay

## 2022-11-08 ENCOUNTER — Emergency Department (HOSPITAL_COMMUNITY)
Admission: EM | Admit: 2022-11-08 | Discharge: 2022-11-08 | Disposition: A | Discharge status: Home / Self Care | Payer: BLUE CROSS/BLUE SHIELD | Attending: Emergency Medicine | Admitting: Emergency Medicine

## 2022-11-08 DIAGNOSIS — D649 Anemia, unspecified: Secondary | ICD-10-CM

## 2022-11-08 DIAGNOSIS — R220 Localized swelling, mass and lump, head: Secondary | ICD-10-CM | POA: Insufficient documentation

## 2022-11-08 DIAGNOSIS — Z01812 Encounter for preprocedural laboratory examination: Secondary | ICD-10-CM

## 2022-11-08 DIAGNOSIS — Z01818 Encounter for other preprocedural examination: Secondary | ICD-10-CM

## 2022-11-08 HISTORY — DX: Essential (primary) hypertension: I10

## 2022-11-08 HISTORY — DX: Family history of other specified conditions: Z84.89

## 2022-11-08 NOTE — ED Provider Notes (Signed)
[place] EMERGENCY DEPARTMENT AT [facility] Provider Note   CSN: [id number] Arrival date & time: 11/08/22  0353     History  Chief Complaint  Patient presents with   Facial Swelling    Melody Hall is a 37 y.o. female.  37 year old female with concern for area of swelling overlying the right maxilla. Patient states she was prescribed Accutane a few weeks ago, took the medication and developed lip swelling, patient stopped the medication and the swelling resolved. Patient started the Accutane again and developed swelling to her right cheek area that is not improving with benadryl PO. Denies difficulty breathing or swallowing, vomiting. Area is not painful and does not itch. Asking about epipen vs PO steroids. No other complaints or concerns.        Home Medications Prior to Admission medications   Medication Sig Start Date End Date Taking? Authorizing Provider  Adapalene-Benzoyl Peroxide 0.3-2.5 % GEL APPLY SMALL AMOUNT AT BEDTIME TO FACE    [provider]  ciprofloxacin (CIPRO) 500 MG tablet Take 1 tablet every 12 hours by oral route for 3 days. 06/04/21   [provider]      Allergies    Patient has no known allergies.    Review of Systems   Review of Systems Negative except as per HPI Physical Exam Updated Vital Signs BP (!) 151/122 (BP Location: Right Arm)   Pulse 98   Temp 100 F (37.8 C) (Oral)   Resp 16   Ht 5\' 4"  (1.626 m)   Wt 86.2 kg   LMP 11/08/2022 (Approximate)   SpO2 99%   BMI 32.61 kg/m  Physical Exam Vitals and nursing note reviewed.  Constitutional:      General: She is not in acute distress.    Appearance: She is well-developed. She is not diaphoretic.  HENT:     Head: Normocephalic and atraumatic.     Nose: Nose normal.     Mouth/Throat:     Mouth: Mucous membranes are moist.  Pulmonary:     Effort: Pulmonary effort is normal.  Musculoskeletal:     Cervical back: Neck supple.  Lymphadenopathy:      Cervical: No cervical adenopathy.  Skin:    General: Skin is warm and dry.     Comments: Appox 3cm x 2cm area of mild swelling/redness to right maxillary area  Neurological:     Mental Status: She is alert and oriented to person, place, and time.  Psychiatric:        Behavior: Behavior normal.     ED Results / Procedures / Treatments   Labs (all labs ordered are listed, but only abnormal results are displayed) Labs Reviewed - No data to display  EKG None  Radiology No results found.  Procedures Procedures    Medications Ordered in ED Medications - No data to display  ED Course/ Medical Decision Making/ A&P                             Medical Decision Making  37 year old female with concern for allergic reaction to Accutane causing swelling to the right maxillary area. Consider cellulitis, allergic reaction, photo sensitivity. Area appears to be local reaction, no systemic symptoms. Recommend diluting cortisone cream with Simple facial moisturizer and limited use (discussed overuse can thin skin and hypopigment).         Final Clinical Impression(s) / ED Diagnoses Final diagnoses:  Facial  swelling    Rx / DC Orders ED Discharge Orders     None         Jeannie Fend, PA-C 11/08/22 0452    Melene Plan, DO 11/08/22 [phone]

## 2022-11-08 NOTE — Discharge Instructions (Addendum)
Possible reaction vs side effect from your Accutane. Area is a small local reaction. Recommend avoiding sun exposure, use Simple facial cleanser and moisturizer, use a small amount of cortisone cream mixed with the Simple moisturizer to the area involved.  If you develop worsening symptoms, return to the ER. Usually, if the medication caused the reaction and has been stopped- symptoms will improve.

## 2022-11-08 NOTE — Patient Instructions (Signed)
Your procedure is scheduled on:11-15-22 Friday Report to the Registration Desk on the 1st floor of the Medical Mall.Then proceed to the 2nd floor Surgery Desk To find out your arrival time, please call [phone] between 1PM - 3PM on:11-14-22 Thursday If your arrival time is 6:00 am, do not arrive before that time as the Medical Mall entrance doors do not open until 6:00 am.  REMEMBER: Instructions that are not followed completely may result in serious medical risk, up to and including death; or upon the discretion of your surgeon and anesthesiologist your surgery may need to be rescheduled.  Do not eat food OR drink any liquids after midnight the night before surgery.  No gum chewing or hard candies.  One week prior to surgery:Last dose on 11-08-22 Stop Anti-inflammatories (NSAIDS) such as Advil, Aleve, Ibuprofen, Motrin, Naproxen, Naprosyn and Aspirin based products such as Excedrin, Goody's Powder, BC Powder.You may however, take Tylenol if needed for pain up until the day of surgery. Stop ANY OVER THE COUNTER supplements/vitamins NOW (11-08-22) until after surgery.  Do NOT take any medication the day of surgery  No Alcohol for 24 hours before or after surgery.  No Smoking including e-cigarettes for 24 hours before surgery.  No chewable tobacco products for at least 6 hours before surgery.  No nicotine patches on the day of surgery.  Do not use any "recreational" drugs for at least a week (preferably 2 weeks) before your surgery.  Please be advised that the combination of cocaine and anesthesia may have negative outcomes, up to and including death. If you test positive for cocaine, your surgery will be cancelled.  On the morning of surgery brush your teeth with toothpaste and water, you may rinse your mouth with mouthwash if you wish. Do not swallow any toothpaste or mouthwash.  Use CHG Soap as directed on instruction sheet.  Do not wear jewelry, make-up, hairpins, clips or nail  polish.  Do not wear lotions, powders, or perfumes.   Do not shave body hair from the neck down 48 hours before surgery.  Contact lenses, hearing aids and dentures may not be worn into surgery.  Do not bring valuables to the hospital. [facility] is not responsible for any missing/lost belongings or valuables.   Notify your doctor if there is any change in your medical condition (cold, fever, infection).  Wear comfortable clothing (specific to your surgery type) to the hospital.  After surgery, you can help prevent lung complications by doing breathing exercises.  Take deep breaths and cough every 1-2 hours. Your doctor may order a device called an Incentive Spirometer to help you take deep breaths. When coughing or sneezing, hold a pillow firmly against your incision with both hands. This is called "splinting." Doing this helps protect your incision. It also decreases belly discomfort.  If you are being admitted to the hospital overnight, leave your suitcase in the car. After surgery it may be brought to your room.  In case of increased patient census, it may be necessary for you, the patient, to continue your postoperative care in the Same Day Surgery department.  If you are being discharged the day of surgery, you will not be allowed to drive home. You will need a responsible individual to drive you home and stay with you for 24 hours after surgery.   If you are taking public transportation, you will need to have a responsible individual with you.  Please call the Pre-admissions Testing Dept. at [phone] if you  have any questions about these instructions.  Surgery Visitation Policy:  Patients having surgery or a procedure may have two visitors.  Children under the age of 32 must have an adult with them who is not the patient.     Preparing for Surgery with CHLORHEXIDINE GLUCONATE (CHG) Soap  Chlorhexidine Gluconate (CHG) Soap  o An antiseptic cleaner that kills germs  and bonds with the skin to continue killing germs even after washing  o Used for showering the night before surgery and morning of surgery  Before surgery, you can play an important role by reducing the number of germs on your skin.  CHG (Chlorhexidine gluconate) soap is an antiseptic cleanser which kills germs and bonds with the skin to continue killing germs even after washing.  Please do not use if you have an allergy to CHG or antibacterial soaps. If your skin becomes reddened/irritated stop using the CHG.  1. Shower the NIGHT BEFORE SURGERY and the MORNING OF SURGERY with CHG soap.  2. If you choose to wash your hair, wash your hair first as usual with your normal shampoo.  3. After shampooing, rinse your hair and body thoroughly to remove the shampoo.  4. Use CHG as you would any other liquid soap. You can apply CHG directly to the skin and wash gently with a scrungie or a clean washcloth.  5. Apply the CHG soap to your body only from the neck down. Do not use on open wounds or open sores. Avoid contact with your eyes, ears, mouth, and genitals (private parts). Wash face and genitals (private parts) with your normal soap.  6. Wash thoroughly, paying special attention to the area where your surgery will be performed.  7. Thoroughly rinse your body with warm water.  8. Do not shower/wash with your normal soap after using and rinsing off the CHG soap.  9. Pat yourself dry with a clean towel.  10. Wear clean pajamas to bed the night before surgery.  12. Place clean sheets on your bed the night of your first shower and do not sleep with pets.  13. Shower again with the CHG soap on the day of surgery prior to arriving at the hospital.  14. Do not apply any deodorants/lotions/powders.  15. Please wear clean clothes to the hospital.

## 2022-11-08 NOTE — ED Triage Notes (Signed)
Pt arrived POV for facial swelling since Tuesday, pt started Accutane a month ago. Pt reports when she first started Accutane she experience lip swelling, stop the Accutane for a few weeks and restarted Accutane a week ago. Pt has tried benadryl w/o much relief, pt has swelling and redness to face, airway patent, pt able to speak in complete sentences.

## 2022-11-11 ENCOUNTER — Inpatient Hospital Stay: Admission: RE | Admit: 2022-11-11 | Payer: BLUE CROSS/BLUE SHIELD | Source: Ambulatory Visit

## 2022-11-11 ENCOUNTER — Ambulatory Visit: Payer: Medicaid Other

## 2022-11-11 ENCOUNTER — Encounter: Payer: Self-pay | Admitting: Urgent Care

## 2022-11-13 ENCOUNTER — Encounter
Admission: RE | Admit: 2022-11-13 | Discharge: 2022-11-13 | Disposition: A | Discharge status: Home / Self Care | Payer: BLUE CROSS/BLUE SHIELD | Source: Ambulatory Visit | Attending: Surgery | Admitting: Surgery

## 2022-11-13 ENCOUNTER — Ambulatory Visit (LOCAL_COMMUNITY_HEALTH_CENTER): Payer: Medicaid Other

## 2022-11-13 VITALS — BP 126/85 | Ht 64.0 in | Wt 194.0 lb

## 2022-11-13 DIAGNOSIS — Z309 Encounter for contraceptive management, unspecified: Secondary | ICD-10-CM

## 2022-11-13 DIAGNOSIS — D649 Anemia, unspecified: Secondary | ICD-10-CM | POA: Diagnosis not present

## 2022-11-13 DIAGNOSIS — Z01812 Encounter for preprocedural laboratory examination: Secondary | ICD-10-CM | POA: Diagnosis present

## 2022-11-13 DIAGNOSIS — Z3042 Encounter for surveillance of injectable contraceptive: Secondary | ICD-10-CM

## 2022-11-13 DIAGNOSIS — Z3009 Encounter for other general counseling and advice on contraception: Secondary | ICD-10-CM

## 2022-11-13 LAB — CBC
HCT: 35.6 % — ABNORMAL LOW (ref 36.0–46.0)
Hemoglobin: 12.8 g/dL (ref 12.0–15.0)
MCH: 28.9 pg (ref 26.0–34.0)
MCHC: 36 g/dL (ref 30.0–36.0)
MCV: 80.4 fL (ref 80.0–100.0)
Platelets: 284 10*3/uL (ref 150–400)
RBC: 4.43 MIL/uL (ref 3.87–5.11)
RDW: 14.8 % (ref 11.5–15.5)
WBC: 6.6 10*3/uL (ref 4.0–10.5)
nRBC: 0 % (ref 0.0–0.2)

## 2022-11-13 NOTE — Progress Notes (Signed)
11 weeks 2 days post depo. Depo given today per order by Aliene Altes, FNP dated 08/26/2022. Tolerated well LUOQ. Next depo due 01/29/2023. Jerel Shepherd, RN

## 2022-11-14 ENCOUNTER — Telehealth: Payer: Self-pay | Admitting: Surgery

## 2022-11-14 NOTE — Telephone Encounter (Signed)
Patient calls, she has broken out in large hives all over her body she states. She cancels surgery for 11/15/22.  Patient will call back when she is getting better with this so that we can get her rescheduled.

## 2022-11-15 ENCOUNTER — Ambulatory Visit
Admission: RE | Admit: 2022-11-15 | Discharge: 2022-11-15 | Disposition: A | Discharge status: Home / Self Care | Payer: BLUE CROSS/BLUE SHIELD | Source: Ambulatory Visit | Attending: Surgery | Admitting: Surgery

## 2022-11-15 ENCOUNTER — Ambulatory Visit: Admission: RE | Admit: 2022-11-15 | Payer: BLUE CROSS/BLUE SHIELD | Source: Home / Self Care | Admitting: Surgery

## 2022-11-15 ENCOUNTER — Encounter: Admission: RE | Payer: Self-pay | Source: Home / Self Care

## 2022-11-15 DIAGNOSIS — N6489 Other specified disorders of breast: Secondary | ICD-10-CM | POA: Insufficient documentation

## 2022-11-15 DIAGNOSIS — R928 Other abnormal and inconclusive findings on diagnostic imaging of breast: Secondary | ICD-10-CM | POA: Insufficient documentation

## 2022-11-15 SURGERY — BREAST BIOPSY WITH RADIO FREQUENCY LOCALIZER
Anesthesia: General | Laterality: Right

## 2022-11-19 ENCOUNTER — Telehealth: Payer: Self-pay | Admitting: Surgery

## 2022-11-19 NOTE — Telephone Encounter (Signed)
Called patient about rescheduling her breast surgery with Dr. Claudine Mouton. Patient did have RF tag placed at the [facility] and had to cancel surgery due to breaking out with severe hives all over.  Called the patient this morning and she is still having trouble with breaking out all over and now says that her lips are swelling.  Patient was advised to call her primary care doctor right away to get this looked at.  Patient will call us back when she gets better.

## 2022-12-06 ENCOUNTER — Emergency Department (HOSPITAL_COMMUNITY)
Admission: EM | Admit: 2022-12-06 | Discharge: 2022-12-06 | Disposition: A | Discharge status: Home / Self Care | Payer: BLUE CROSS/BLUE SHIELD | Attending: Emergency Medicine | Admitting: Emergency Medicine

## 2022-12-06 ENCOUNTER — Encounter (HOSPITAL_COMMUNITY): Payer: Self-pay

## 2022-12-06 ENCOUNTER — Other Ambulatory Visit: Payer: Self-pay

## 2022-12-06 DIAGNOSIS — R000 Tachycardia, unspecified: Secondary | ICD-10-CM | POA: Diagnosis not present

## 2022-12-06 DIAGNOSIS — R220 Localized swelling, mass and lump, head: Secondary | ICD-10-CM | POA: Diagnosis not present

## 2022-12-06 LAB — CBC
HCT: 40.1 % (ref 36.0–46.0)
Hemoglobin: 14.4 g/dL (ref 12.0–15.0)
MCH: 28.8 pg (ref 26.0–34.0)
MCHC: 35.9 g/dL (ref 30.0–36.0)
MCV: 80.2 fL (ref 80.0–100.0)
Platelets: 328 10*3/uL (ref 150–400)
RBC: 5 MIL/uL (ref 3.87–5.11)
RDW: 14.4 % (ref 11.5–15.5)
WBC: 6 10*3/uL (ref 4.0–10.5)
nRBC: 0 % (ref 0.0–0.2)

## 2022-12-06 LAB — BASIC METABOLIC PANEL
Anion gap: 8 (ref 5–15)
BUN: 11 mg/dL (ref 6–20)
CO2: 23 mmol/L (ref 22–32)
Calcium: 9.6 mg/dL (ref 8.9–10.3)
Chloride: 107 mmol/L (ref 98–111)
Creatinine, Ser: 0.92 mg/dL (ref 0.44–1.00)
GFR, Estimated: >60 mL/min (ref >60)
Glucose, Bld: 94 mg/dL (ref 70–99)
Potassium: 3.8 mmol/L (ref 3.5–5.1)
Sodium: 138 mmol/L (ref 135–145)

## 2022-12-06 MED ORDER — METHYLPREDNISOLONE SODIUM SUCC 125 MG IJ SOLR
125.0000 mg | Freq: Once | INTRAMUSCULAR | Status: AC
  Administered 2022-12-06: 125 mg via INTRAMUSCULAR
  Filled 2022-12-06: qty 2

## 2022-12-06 MED ORDER — PREDNISONE 20 MG PO TABS: 60.0000 mg | ORAL_TABLET | Freq: Every day | ORAL | 0 refills | Status: AC

## 2022-12-06 MED ORDER — METHYLPREDNISOLONE SODIUM SUCC 125 MG IJ SOLR: 125.0000 mg | Freq: Once | INTRAMUSCULAR | Status: DC

## 2022-12-06 NOTE — Discharge Instructions (Signed)
Your exam is reassuring.  You received a shot of steroid in the emergency department.  I have sent in steroids into the pharmacy for you.  Start taking these tomorrow.  Follow-up with the primary care provider as listed for you above.

## 2022-12-06 NOTE — ED Triage Notes (Signed)
Pt states that she has been having an allergic reaction on and off. Pt states she has been having hives, facial swelling, and lip swelling. She will take benadryl which will help the hives, but then will cause facial swelling. Pt was seen at [place] 1 month ago for same but has not had any improvement.

## 2022-12-06 NOTE — ED Provider Notes (Signed)
[place] EMERGENCY DEPARTMENT AT [facility] Provider Note   CSN: [id number] Arrival date & time: 12/06/22  1203     History  Chief Complaint  Patient presents with   Allergic Reaction    Melody Hall is a 37 y.o. female.  37 year old female presents today for intermittent facial swelling over the past month.  Denies any difficulty breathing during these episodes.  She also states she occasionally gets a rash that is generalized.  Currently without rash.  She states she had lip swelling yesterday which is resolved.  States she has little bit of swelling left to her cheeks.  Denies other complaints.  Unsure of what her trigger is.  States she has not changed anything recently in terms of food environment, or medications.  She has been taking Benadryl which does help.  Does not have a primary care provider.  She has not had allergy testing.  The history is provided by the patient. No language interpreter was used.       Home Medications Prior to Admission medications   Medication Sig Start Date End Date Taking? Authorizing Provider  diphenhydrAMINE (BENADRYL) 25 mg capsule Take 25 mg by mouth every 6 (six) hours as needed.    [provider]      Allergies    Patient has no known allergies.    Review of Systems   Review of Systems  Constitutional:  Negative for chills and fever.  Respiratory:  Negative for shortness of breath.   Cardiovascular:  Negative for chest pain.  Skin:  Positive for rash (not currenlty).  All other systems reviewed and are negative.   Physical Exam Updated Vital Signs BP (!) 127/105 (BP Location: Right Arm)   Pulse (!) 115   Temp 99.1 F (37.3 C) (Oral)   Resp 18   Ht 5\' 4"  (1.626 m)   Wt 86.2 kg   LMP 11/08/2022 (Approximate)   SpO2 98%   BMI 32.61 kg/m  Physical Exam Vitals and nursing note reviewed.  Constitutional:      General: She is not in acute distress.    Appearance: Normal appearance. She is not  ill-appearing.  HENT:     Head: Normocephalic and atraumatic.     Nose: Nose normal.  Eyes:     General: No scleral icterus.    Extraocular Movements: Extraocular movements intact.     Conjunctiva/sclera: Conjunctivae normal.  Cardiovascular:     Rate and Rhythm: Normal rate and regular rhythm.     Heart sounds: Normal heart sounds.     Comments: Initially noted to be tachycardic.  Heart rate around 90 during my interview. Pulmonary:     Effort: Pulmonary effort is normal. No respiratory distress.     Breath sounds: Normal breath sounds. No wheezing or rales.  Abdominal:     General: There is no distension.     Tenderness: There is no abdominal tenderness.  Musculoskeletal:        General: Normal range of motion.     Cervical back: Normal range of motion.  Skin:    General: Skin is warm and dry.  Neurological:     General: No focal deficit present.     Mental Status: She is alert. Mental status is at baseline.     ED Results / Procedures / Treatments   Labs (all labs ordered are listed, but only abnormal results are displayed) Labs Reviewed  BASIC METABOLIC PANEL  CBC    EKG None  Radiology  No results found.  Procedures Procedures    Medications Ordered in ED Medications - No data to display  ED Course/ Medical Decision Making/ A&P                             Medical Decision Making Amount and/or Complexity of Data Reviewed Labs: ordered.   37 year old female presents today for concern of allergic reaction.  Intermittently occurring over the past month.  No known trigger.  Currently without any significant symptoms.  No facial swelling either.  Airways intact.  She does sense that her face is currently swollen compared to her baseline.  No concerning signs or symptoms noted on exam.  Will give her systemic steroids.  Discussed establishing a primary care provider and having allergy testing completed.  She is in agreement with this.  Discharged in stable  condition.   Final Clinical Impression(s) / ED Diagnoses Final diagnoses:  Facial swelling    Rx / DC Orders ED Discharge Orders          Ordered    predniSONE (DELTASONE) 20 MG tablet  Daily with breakfast        12/06/22 1445              Marita Kansas, PA-C 12/06/22 1509    Glynn Octave, MD 12/06/22 1531

## 2022-12-24 ENCOUNTER — Telehealth: Payer: Self-pay | Admitting: Surgery

## 2022-12-24 NOTE — Telephone Encounter (Signed)
Left message for patient to call the office to schedule follow up appointment with Dr. Claudine Mouton so that we can possibly get her rescheduled for surgery.

## 2023-01-09 ENCOUNTER — Telehealth: Payer: Self-pay | Admitting: Surgery

## 2023-01-09 NOTE — Telephone Encounter (Signed)
Tried calling patient at both numbers, both numbers mail box full.  If patient calls, have been trying to get her to schedule appointment with Dr. Claudine Mouton so that we can discuss resheduling of her surgery.

## 2023-01-24 ENCOUNTER — Telehealth: Payer: Self-pay | Admitting: Surgery

## 2023-01-24 NOTE — Telephone Encounter (Signed)
Tried calling patient again for follow through.  To date, patient has yet to return any of our calls for rescheduling of her surgery.  When trying to call again today, her number has now been disconnected.   If patient calls back, she will need follow up with Dr  Claudine Mouton.

## 2023-01-28 ENCOUNTER — Encounter (HOSPITAL_COMMUNITY): Payer: Self-pay

## 2023-01-28 ENCOUNTER — Emergency Department (HOSPITAL_COMMUNITY)
Admission: EM | Admit: 2023-01-28 | Discharge: 2023-01-28 | Disposition: A | Discharge status: Home / Self Care | Payer: BLUE CROSS/BLUE SHIELD | Attending: Emergency Medicine | Admitting: Emergency Medicine

## 2023-01-28 ENCOUNTER — Other Ambulatory Visit: Payer: Self-pay

## 2023-01-28 DIAGNOSIS — L509 Urticaria, unspecified: Secondary | ICD-10-CM | POA: Insufficient documentation

## 2023-01-28 DIAGNOSIS — R21 Rash and other nonspecific skin eruption: Secondary | ICD-10-CM | POA: Diagnosis present

## 2023-01-28 DIAGNOSIS — I10 Essential (primary) hypertension: Secondary | ICD-10-CM | POA: Insufficient documentation

## 2023-01-28 MED ORDER — HYDROXYZINE HCL 25 MG PO TABS: 25.0000 mg | ORAL_TABLET | Freq: Four times a day (QID) | ORAL | 0 refills | Status: DC | PRN

## 2023-01-28 MED ORDER — FAMOTIDINE 20 MG PO TABS
20.0000 mg | ORAL_TABLET | Freq: Once | ORAL | Status: AC
  Administered 2023-01-28: 20 mg via ORAL
  Filled 2023-01-28: qty 1

## 2023-01-28 MED ORDER — PREDNISONE 20 MG PO TABS: ORAL_TABLET | ORAL | 0 refills | Status: DC

## 2023-01-28 MED ORDER — PREDNISONE 20 MG PO TABS
60.0000 mg | ORAL_TABLET | Freq: Once | ORAL | Status: AC
  Administered 2023-01-28: 60 mg via ORAL
  Filled 2023-01-28: qty 3

## 2023-01-28 NOTE — ED Provider Notes (Signed)
[place] EMERGENCY DEPARTMENT AT [facility] Provider Note   CSN: [id number] Arrival date & time: 01/28/23  4098     History  Chief Complaint  Patient presents with   Urticaria    Melody Hall is a 37 y.o. female.  The history is provided by the patient and medical records.  Urticaria   37 year old female with history of anemia, depression, hypertension, presenting to the ED with facial swelling and rash.  Reports this has been happening frequently over the past few months.  She did see allergist last month who felt like this was likely idiopathic, however she did not have any formal allergy testing done.  She was told to take daily allergy medication and Benadryl which she has been doing but continues having intermittent reactions.  Current reaction began around 11 PM.  States she feels generally itchy.  She is already taking 6 Benadryl without much change.  She denies any sensation of throat swelling or difficulty swallowing.  No new soaps, detergents, foods, or medications.  She does not have any known allergies.  Home Medications Prior to Admission medications   Medication Sig Start Date End Date Taking? Authorizing Provider  hydrOXYzine (ATARAX) 25 MG tablet Take 1 tablet (25 mg total) by mouth every 6 (six) hours as needed for itching. 01/28/23  Yes Garlon Hatchet, PA-C  predniSONE (DELTASONE) 20 MG tablet Take 40 mg by mouth daily for 3 days, then 20mg  by mouth daily for 3 days, then 10mg  daily for 3 days 01/28/23  Yes Garlon Hatchet, PA-C  diphenhydrAMINE (BENADRYL) 25 mg capsule Take 25 mg by mouth every 6 (six) hours as needed.    [provider]      Allergies    Patient has no known allergies.    Review of Systems   Review of Systems  Skin:  Positive for rash.  All other systems reviewed and are negative.   Physical Exam Updated Vital Signs BP (!) 137/105 (BP Location: Left Arm)   Pulse 96   Temp 98.3 F (36.8 C) (Oral)   Resp 18    Ht 5\' 5"  (1.651 m)   Wt 86.2 kg   SpO2 100%   BMI 31.62 kg/m   Physical Exam Vitals and nursing note reviewed.  Constitutional:      Appearance: She is well-developed.  HENT:     Head: Normocephalic and atraumatic.     Mouth/Throat:     Comments: No lip or tongue swelling, handling secretions well, normal phonation without stridor Eyes:     Conjunctiva/sclera: Conjunctivae normal.     Pupils: Pupils are equal, round, and reactive to light.     Comments: Mild puffiness around lower lateral margin of left eye, there is no significant upper lid edema or erythema, no induration  Cardiovascular:     Rate and Rhythm: Normal rate and regular rhythm.     Heart sounds: Normal heart sounds.  Pulmonary:     Effort: Pulmonary effort is normal.     Breath sounds: Normal breath sounds.  Abdominal:     General: Bowel sounds are normal.     Palpations: Abdomen is soft.  Musculoskeletal:        General: Normal range of motion.     Cervical back: Normal range of motion.  Skin:    General: Skin is warm and dry.     Findings: Rash present. Rash is urticarial.     Comments: Faint urticarial rash, seems concentrated along upper extremities  and torso, no lesions on palms, scratching during exam  Neurological:     Mental Status: She is alert and oriented to person, place, and time.     ED Results / Procedures / Treatments   Labs (all labs ordered are listed, but only abnormal results are displayed) Labs Reviewed - No data to display  EKG None  Radiology No results found.  Procedures Procedures    Medications Ordered in ED Medications  predniSONE (DELTASONE) tablet 60 mg (60 mg Oral Given 01/28/23 0124)  famotidine (PEPCID) tablet 20 mg (20 mg Oral Given 01/28/23 0124)    ED Course/ Medical Decision Making/ A&P                             Medical Decision Making Risk Prescription drug management.   37 year old female presenting to the ED with rash and some swelling around  her left eye.  Has been having intermittent allergic reactions over the past few months.  Saw allergist recently but was simply put on allergy medicine and as needed Benadryl.  She did not have any formal testing.  Current reaction began last night around 11 PM, has some mild swelling around the left eye and scattered hives on the upper extremities.  She has no lip or tongue swelling, handling secretions well, normal phonation without stridor.  No signs or symptoms concerning for acute anaphylaxis.  She has already had a large amount of Benadryl prior to arrival so given prednisone and Pepcid.  States Benadryl does not really seem to be helping her, can try hydroxyzine but specifically instructed not to take both.  Will continue prednisone taper as well.  She was encouraged to call her allergist in the morning for follow-up.  She can return here for any new or acute changes.  Final Clinical Impression(s) / ED Diagnoses Final diagnoses:  Urticaria    Rx / DC Orders ED Discharge Orders          Ordered    predniSONE (DELTASONE) 20 MG tablet        01/28/23 0157    hydrOXYzine (ATARAX) 25 MG tablet  Every 6 hours PRN        01/28/23 0157              Garlon Hatchet, PA-C 01/28/23 [id number]    Tilden Fossa, MD 01/28/23 [phone]

## 2023-01-28 NOTE — ED Triage Notes (Signed)
Noticed hives on both arms tonight after work. Says she has had this happen several times over the last few months.   No known allergies.

## 2023-01-28 NOTE — Discharge Instructions (Signed)
Take the prescribed medication as directed-- use the hydroxyzine OR the benadryl but NOT BOTH! Follow-up with your allergist--I would call in the morning for follow-up appt. Return to the ED for new or worsening symptoms.

## 2023-02-10 NOTE — Progress Notes (Deleted)
Patient ID: Melody Hall, female   DOB: [birth year], 37 y.o.   MRN: [id number]  Chief Complaint:  FEA/radial scar, left breast.   History of Present Illness   Seen last March where  Melody Hall is a 37 y.o. 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  She feels it has grown somewhat larger though that is not why she is here today.  She has had recent stereotactic biopsy of an area of distortion, and ultrasound-guided biopsies of the most concerning of masses. She is currently taking birth control.  She has no family history of breast cancer.  She began menstruating at the age of 14.  She is gravida 2 para 2.  She is 19 with her first pregnancy.  She did not breast-feed.  She has known palpable breast lumps, most notably upper right breast.  No history of nipple discharge or dermal changes.  She denies any history of breast pain.  Past Medical History Past Medical History:  Diagnosis Date   Anemia    2016   Breast lesion 07/31/2018   Depression    Elevated blood pressure reading    Family history of adverse reaction to anesthesia    Hypertension    Mental disorder    Depression      Past Surgical History:  Procedure Laterality Date   BREAST BIOPSY Left 08/14/2022   Stereo Bx coil clip - path pending   BREAST BIOPSY Left 08/14/2022   [place] LT BREAST BX W LOC DEV 1ST LESION IMG BX SPEC US GUIDE 08/14/2022 [facility]   BREAST BIOPSY Left 08/14/2022   MM LT BREAST BX W LOC DEV 1ST LESION IMAGE BX SPEC STEREO GUIDE 08/14/2022 [facility]   BREAST BIOPSY Right 08/28/2022   u/s bx, 1:00 RIBBON clip-path pending   BREAST BIOPSY Left 08/28/2022   u/s bx, 9:00  RIBBON clip-path pending   BREAST BIOPSY Left 08/28/2022   [place] LT BREAST BX W LOC DEV 1ST LESION IMG BX SPEC US GUIDE 08/28/2022 [facility]   BREAST BIOPSY Right 08/28/2022   [place] RT BREAST BX W LOC DEV 1ST LESION IMG BX SPEC US  GUIDE 08/28/2022 [facility]   BREAST LUMPECTOMY WITH RADIOFREQUENCY TAG IDENTIFICATION Left 10/30/2022   RF tag for coil clip CSL   NO PAST SURGERIES      No Known Allergies   Current Outpatient Medications  Medication Sig Dispense Refill   diphenhydrAMINE (BENADRYL) 25 mg capsule Take 25 mg by mouth every 6 (six) hours as needed.     hydrOXYzine (ATARAX) 25 MG tablet Take 1 tablet (25 mg total) by mouth every 6 (six) hours as needed for itching. 12 tablet 0   predniSONE (DELTASONE) 20 MG tablet Take 40 mg by mouth daily for 3 days, then 20mg  by mouth daily for 3 days, then 10mg  daily for 3 days 12 tablet 0   Current Facility-Administered Medications  Medication Dose Route Frequency Provider Last Rate Last Admin   medroxyPROGESTERone (DEPO-PROVERA) injection 150 mg  150 mg Intramuscular Q90 days Lenice Llamas, FNP   150 mg at 11/13/22 1707    Family History Family History  Problem Relation Age of Onset   Healthy Paternal Grandfather    Healthy Paternal Grandmother    Healthy Maternal Grandmother    Healthy Maternal Grandfather    Healthy Father    Healthy Mother       Social History Social History  Tobacco Use   Smoking status: Never    Passive exposure: Never   Smokeless tobacco: Never  Vaping Use   Vaping status: Never Used  Substance Use Topics   Alcohol use: Yes    Alcohol/week: 2.0 standard drinks of alcohol    Types: 2 Glasses of wine per week    Comment: occ   Drug use: Not Currently    Types: Marijuana        Review of Systems  Constitutional: Negative.   HENT: Negative.    Eyes: Negative.   Respiratory: Negative.    Cardiovascular: Negative.   Gastrointestinal: Negative.   Genitourinary: Negative.   Skin: Negative.   Neurological: Negative.   Psychiatric/Behavioral: Negative.       Physical Exam There were no vitals taken for this visit.    CONSTITUTIONAL: Well developed, and nourished, appropriately responsive and aware  without distress.   EYES: Sclera non-icteric.   EARS, NOSE, MOUTH AND THROAT:  The oropharynx is clear. Oral mucosa is pink and moist.     Hearing is intact to voice.  NECK: Trachea is midline, and there is no jugular venous distension.  LYMPH NODES:  Lymph nodes in the neck are not appreciated. RESPIRATORY:  Lungs are clear, and breath sounds are equal bilaterally.   Normal respiratory effort without pathologic use of accessory muscles. CARDIOVASCULAR: Heart is regular in rate and rhythm.   Well perfused.  GI: The abdomen is  soft, nontender, and nondistended.  GU: [name] is present as chaperone.  There is a very large prominent right upper breast mass that is visible from a distance.  There are other smaller masses in both breasts consistent with benign fibroadenomas. MUSCULOSKELETAL:  Symmetrical muscle tone appreciated in all four extremities.    SKIN: Skin turgor is normal. No pathologic skin lesions appreciated.  NEUROLOGIC:  Motor and sensation appear grossly normal.  Cranial nerves are grossly without defect. PSYCH:  Alert and oriented to person, place and time. Affect is appropriate for situation.  Data Reviewed I have personally reviewed what is currently available of the patient's imaging, recent labs and medical records.   Labs:     Latest Ref Rng & Units 12/06/2022   12:38 PM 11/13/2022    2:05 PM  CBC  WBC 4.0 - 10.5 K/uL 6.0  6.6   Hemoglobin 12.0 - 15.0 g/dL 16.1  09.6   Hematocrit 36.0 - 46.0 % 40.1  35.6   Platelets 150 - 400 K/uL 328  284       Latest Ref Rng & Units 12/06/2022   12:38 PM  CMP  Glucose 70 - 99 mg/dL 94   BUN 6 - 20 mg/dL 11   Creatinine 0.45 - 1.00 mg/dL 4.09   Sodium 811 - 914 mmol/L 138   Potassium 3.5 - 5.1 mmol/L 3.8   Chloride 98 - 111 mmol/L 107   CO2 22 - 32 mmol/L 23   Calcium 8.9 - 10.3 mg/dL 9.6    SURGICAL PATHOLOGY  CASE: [id number]  PATIENT: Melody Hall  Surgical Pathology Report   Specimen Submitted:  A. Breast,  right  B. Breast, left   Clinical History: 37 YO F with 1) 3.1 cm upper inner right breast mass -  1:00 position, 2) 4.4 cm inner left breast mass - 9:00 position. A -  Ribbon-shaped clip placed following ultrasound guided biopsy of RIGHT  breast at 1 o'clock. B - Ribbon-shaped clip placed following ultrasound  guided biopsy of LEFT breast at  9 o'clock.   DIAGNOSIS:  A. BREAST, RIGHT AT 1:00, 8 CM FROM THE NIPPLE; ULTRASOUND-GUIDED CORE  NEEDLE BIOPSY:  - FRAGMENTS OF BENIGN FIBROADENOMA.  - NEGATIVE FOR ATYPICAL PROLIFERATIVE BREAST DISEASE.   B. BREAST, LEFT AT 9:00, 3 CM FROM THE NIPPLE; ULTRASOUND-GUIDED CORE  NEEDLE BIOPSY:  - FRAGMENTS OF BENIGN FIBROADENOMA, WITH FOCAL USUAL DUCTAL HYPERPLASIA.  - NEGATIVE FOR ATYPICAL PROLIFERATIVE BREAST DISEASE.    SURGICAL PATHOLOGY  CASE: [id number]  PATIENT: Gunda Kirchgessner  Surgical Pathology Report   Specimen Submitted:  A. Breast, left, 3:00  B. Breast, left UIQ   Clinical History: Mass.  R/O fibroadenoma vs IMC.  Distortion.  R/O IMC.  A - Heart-shaped clip placed following ultrasound guided biopsy of LEFT  breast at 3 o'clock. B - Coil-shaped clip placed following stereotactic  biopsy of LEFT breast, upper inner quadrant.   DIAGNOSIS:  A. BREAST, LEFT AT 3:00, 4 CM FROM THE NIPPLE; ULTRASOUND-GUIDED CORE  NEEDLE BIOPSY:  - FRAGMENTS OF BENIGN FIBROADENOMA, WITH ASSOCIATED DYSTROPHIC  CALCIFICATIONS.  - NEGATIVE FOR ATYPICAL PROLIFERATIVE BREAST DISEASE.   B. BREAST, LEFT UPPER INNER QUADRANT; STEREOTACTIC CORE NEEDLE BIOPSY:  - FLAT EPITHELIAL ATYPIA.  - BACKGROUND CHANGES SUGGESTIVE OF COMPLEX SCLEROSING LESION/RADIAL  SCAR, WITH ASSOCIATED COLUMNAR CELL CHANGE.  - NEGATIVE FOR CARCINOMA IN SITU AND MALIGNANCY.   Imaging: Radiological images reviewed:   CLINICAL DATA:  Palpable mass right breast.   EXAM: DIGITAL DIAGNOSTIC BILATERAL MAMMOGRAM WITH TOMOSYNTHESIS; ULTRASOUND LEFT BREAST LIMITED; ULTRASOUND  RIGHT BREAST LIMITED   TECHNIQUE: Bilateral digital diagnostic mammography and breast tomosynthesis was performed.; Targeted ultrasound examination of the left breast was performed.; Targeted ultrasound examination of the right breast was performed   COMPARISON:  None.   ACR Breast Density Category b: There are scattered areas of fibroglandular density.   FINDINGS: Cc and MLO views of bilateral breasts, spot tangential right breast are submitted. There is a mass at the palpable area right breast 12 o'clock. A second mass is noted in the upper-outer quadrant right breast. There is a third mass in the medial slight lower right breast.   Images of the left breast demonstrate architectural distortion in the medial probably 3 o'clock left breast. There is a mass in the subareolar lower inner quadrant left breast. There is a mass in the posterior lower inner quadrant left breast. There is a mass in the upper-outer quadrant left breast. At least 2 central mid depth subareolar masses are identified.   Targeted ultrasound is performed, showing a 3.1 x 1.8 x 2.9 cm oval macrolobulated hypoechoic mass at right breast 1 o'clock 8 cm nipple. At the right breast 2 o'clock 10 cm from nipple, there is a 2.4 x 0.9 x 1.6 cm oval hypoechoic mass. At right breast 10 o'clock 10 cm nipple, there is a 3.8 x 2.4 x 3.8 cm macro lobulated hypoechoic mass. These correlate to the mammographic masses. Ultrasound of the right axilla is negative.   Ultrasound of the left breast demonstrate a 4.4 x 1.3 x 2.9 cm oval heterogeneous hypoechoic lesion at the left breast 9 o'clock 3 cm from nipple correlating to the mass in the subareolar lower inner quadrant left breast. There are no focal discrete lesion identified that would correlate to the architectural distortion seen mammogram in the medial left breast. At the left breast 3 o'clock 4 cm from nipple, there is slight lobulated mixed echotexture measuring  0.8 x 0.5 x 0.6 cm. At the left breast 1 o'clock 2 cm from  nipple, there is a 0.9 x 0.5 x 0.8 cm oval hypoechoic mass. At the left breast 8 o'clock subareolar region, there is 1.1 x 0.6 x 1 cm oval hypoechoic mass. Ultrasound of the left axilla is negative.   IMPRESSION: Highly suspicious findings.   RECOMMENDATION: Recommend stereotactic core biopsy of the architectural distortion in the medial left breast. Also recommend ultrasound-guided core biopsy of the left breast 3 o'clock mass. If either one of these biopsies is positive for cancer, consider ultrasound-guided core biopsy of the 4 cm mass at left breast 9 o'clock and the largest mass in right breast at 1 o'clock.   I have discussed the findings and recommendations with the patient. If applicable, a reminder letter will be sent to the patient regarding the next appointment.   BI-RADS CATEGORY  5: Highly suggestive of malignancy.     Electronically Signed   By: Sherian Rein M.D.   On: 06/21/2022 16:42  CLINICAL DATA:  Palpable mass right breast.   EXAM: DIGITAL DIAGNOSTIC BILATERAL MAMMOGRAM WITH TOMOSYNTHESIS; ULTRASOUND LEFT BREAST LIMITED; ULTRASOUND RIGHT BREAST LIMITED   TECHNIQUE: Bilateral digital diagnostic mammography and breast tomosynthesis was performed.; Targeted ultrasound examination of the left breast was performed.; Targeted ultrasound examination of the right breast was performed   COMPARISON:  None.   ACR Breast Density Category b: There are scattered areas of fibroglandular density.   FINDINGS: Cc and MLO views of bilateral breasts, spot tangential right breast are submitted. There is a mass at the palpable area right breast 12 o'clock. A second mass is noted in the upper-outer quadrant right breast. There is a third mass in the medial slight lower right breast.   Images of the left breast demonstrate architectural distortion in the medial probably 3 o'clock left breast. There is a mass  in the subareolar lower inner quadrant left breast. There is a mass in the posterior lower inner quadrant left breast. There is a mass in the upper-outer quadrant left breast. At least 2 central mid depth subareolar masses are identified.   Targeted ultrasound is performed, showing a 3.1 x 1.8 x 2.9 cm oval macrolobulated hypoechoic mass at right breast 1 o'clock 8 cm nipple. At the right breast 2 o'clock 10 cm from nipple, there is a 2.4 x 0.9 x 1.6 cm oval hypoechoic mass. At right breast 10 o'clock 10 cm nipple, there is a 3.8 x 2.4 x 3.8 cm macro lobulated hypoechoic mass. These correlate to the mammographic masses. Ultrasound of the right axilla is negative.   Ultrasound of the left breast demonstrate a 4.4 x 1.3 x 2.9 cm oval heterogeneous hypoechoic lesion at the left breast 9 o'clock 3 cm from nipple correlating to the mass in the subareolar lower inner quadrant left breast. There are no focal discrete lesion identified that would correlate to the architectural distortion seen mammogram in the medial left breast. At the left breast 3 o'clock 4 cm from nipple, there is slight lobulated mixed echotexture measuring 0.8 x 0.5 x 0.6 cm. At the left breast 1 o'clock 2 cm from nipple, there is a 0.9 x 0.5 x 0.8 cm oval hypoechoic mass. At the left breast 8 o'clock subareolar region, there is 1.1 x 0.6 x 1 cm oval hypoechoic mass. Ultrasound of the left axilla is negative.   IMPRESSION: Highly suspicious findings.   RECOMMENDATION: Recommend stereotactic core biopsy of the architectural distortion in the medial left breast. Also recommend ultrasound-guided core biopsy of the left breast 3 o'clock mass. If  either one of these biopsies is positive for cancer, consider ultrasound-guided core biopsy of the 4 cm mass at left breast 9 o'clock and the largest mass in right breast at 1 o'clock.   I have discussed the findings and recommendations with the patient. If applicable, a  reminder letter will be sent to the patient regarding the next appointment.   BI-RADS CATEGORY  5: Highly suggestive of malignancy.     Electronically Signed   By: Sherian Rein M.D.   On: 06/21/2022 16:42 Within last 24 hrs: No results found.  Assessment    Radial scar/complex sclerosing lesion, flat epithelial atypia, upper inner quadrant left breast. Benign fibroadenomas. 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  Possible right breast lumpectomy.  Risks of proceeding with breast biopsy discussed in detail with patient these include but not limited to anesthesia, bleeding, infection, need for additional procedures, need for follow-up imaging regardless of pathology.  She understands that if she should desire we could proceed with lumpectomy from the right breast as it is quite prominent.  Otherwise her questions have been adequately answered, no guarantees were ever expressed or implied.  Face-to-face time spent with the patient and accompanying care providers(if present) was 40 minutes, with more than 50% of the time spent counseling, educating, and coordinating care of the patient.    These notes generated with voice recognition software. I apologize for typographical errors.  Campbell Lerner M.D., FACS 02/10/2023, 11:22 AM

## 2023-02-11 ENCOUNTER — Ambulatory Visit: Payer: BLUE CROSS/BLUE SHIELD | Admitting: Surgery

## 2023-02-18 ENCOUNTER — Emergency Department (HOSPITAL_COMMUNITY): Payer: BLUE CROSS/BLUE SHIELD

## 2023-02-18 ENCOUNTER — Emergency Department (HOSPITAL_COMMUNITY)
Admission: EM | Admit: 2023-02-18 | Discharge: 2023-02-18 | Disposition: A | Discharge status: Home / Self Care | Payer: BLUE CROSS/BLUE SHIELD | Attending: Emergency Medicine | Admitting: Emergency Medicine

## 2023-02-18 ENCOUNTER — Encounter (HOSPITAL_COMMUNITY): Payer: Self-pay

## 2023-02-18 ENCOUNTER — Other Ambulatory Visit: Payer: Self-pay

## 2023-02-18 ENCOUNTER — Ambulatory Visit: Payer: BLUE CROSS/BLUE SHIELD | Admitting: Surgery

## 2023-02-18 DIAGNOSIS — R0789 Other chest pain: Secondary | ICD-10-CM | POA: Diagnosis present

## 2023-02-18 DIAGNOSIS — I10 Essential (primary) hypertension: Secondary | ICD-10-CM | POA: Insufficient documentation

## 2023-02-18 DIAGNOSIS — Z1152 Encounter for screening for COVID-19: Secondary | ICD-10-CM | POA: Insufficient documentation

## 2023-02-18 DIAGNOSIS — R091 Pleurisy: Secondary | ICD-10-CM | POA: Insufficient documentation

## 2023-02-18 LAB — COMPREHENSIVE METABOLIC PANEL
ALT: 57 U/L — ABNORMAL HIGH (ref 0–44)
AST: 25 U/L (ref 15–41)
Albumin: 4.3 g/dL (ref 3.5–5.0)
Alkaline Phosphatase: 81 U/L (ref 38–126)
Anion gap: 8 (ref 5–15)
BUN: 17 mg/dL (ref 6–20)
CO2: 23 mmol/L (ref 22–32)
Calcium: 9.4 mg/dL (ref 8.9–10.3)
Chloride: 106 mmol/L (ref 98–111)
Creatinine, Ser: 0.75 mg/dL (ref 0.44–1.00)
GFR, Estimated: >60 mL/min (ref >60)
Glucose, Bld: 93 mg/dL (ref 70–99)
Potassium: 3.5 mmol/L (ref 3.5–5.1)
Sodium: 137 mmol/L (ref 135–145)
Total Bilirubin: 0.4 mg/dL (ref 0.3–1.2)
Total Protein: 7.4 g/dL (ref 6.5–8.1)

## 2023-02-18 LAB — CBC WITH DIFFERENTIAL/PLATELET
Abs Immature Granulocytes: 0.01 10*3/uL (ref 0.00–0.07)
Basophils Absolute: 0 10*3/uL (ref 0.0–0.1)
Basophils Relative: 0 %
Eosinophils Absolute: 0.2 10*3/uL (ref 0.0–0.5)
Eosinophils Relative: 2 %
HCT: 37.4 % (ref 36.0–46.0)
Hemoglobin: 13.2 g/dL (ref 12.0–15.0)
Immature Granulocytes: 0 %
Lymphocytes Relative: 34 %
Lymphs Abs: 3.3 10*3/uL (ref 0.7–4.0)
MCH: 28.5 pg (ref 26.0–34.0)
MCHC: 35.3 g/dL (ref 30.0–36.0)
MCV: 80.8 fL (ref 80.0–100.0)
Monocytes Absolute: 0.6 10*3/uL (ref 0.1–1.0)
Monocytes Relative: 6 %
Neutro Abs: 5.7 10*3/uL (ref 1.7–7.7)
Neutrophils Relative %: 58 %
Platelets: 302 10*3/uL (ref 150–400)
RBC: 4.63 MIL/uL (ref 3.87–5.11)
RDW: 14.4 % (ref 11.5–15.5)
WBC: 9.7 10*3/uL (ref 4.0–10.5)
nRBC: 0 % (ref 0.0–0.2)

## 2023-02-18 LAB — HCG, QUANTITATIVE, PREGNANCY: hCG, Beta Chain, Quant, S: <1 m[IU]/mL (ref <5)

## 2023-02-18 LAB — TROPONIN I (HIGH SENSITIVITY)
Troponin I (High Sensitivity): <2 ng/L (ref <18)
Troponin I (High Sensitivity): 2 ng/L (ref <18)

## 2023-02-18 LAB — SARS CORONAVIRUS 2 BY RT PCR: SARS Coronavirus 2 by RT PCR: NEGATIVE

## 2023-02-18 LAB — D-DIMER, QUANTITATIVE: D-Dimer, Quant: <0.27 ug/mL-FEU (ref 0.00–0.50)

## 2023-02-18 MED ORDER — ONDANSETRON 4 MG PO TBDP: 4.0000 mg | ORAL_TABLET | Freq: Once | ORAL | Status: DC

## 2023-02-18 MED ORDER — KETOROLAC TROMETHAMINE 15 MG/ML IJ SOLN
15.0000 mg | Freq: Once | INTRAMUSCULAR | Status: AC
  Administered 2023-02-18: 15 mg via INTRAVENOUS
  Filled 2023-02-18: qty 1

## 2023-02-18 NOTE — ED Triage Notes (Signed)
Patient reports intermittent mid chest pain and nausea starting this AM. Patient denies SOB and vomiting. Patient denies cardiac hx.

## 2023-02-18 NOTE — Discharge Instructions (Signed)
Your EKG, chest x-ray and laboratory evaluation was overall reassuring.  Please follow-up with a primary care physician for consideration for outpatient stress testing.  Can trial NSAIDs for pain control for treatment of pleuritis, COVID PCR testing remains pending at time of discharge.  Results will be available on the patient portal.

## 2023-02-18 NOTE — ED Provider Notes (Signed)
[place] EMERGENCY DEPARTMENT AT [facility] Provider Note   CSN: [id number] Arrival date & time: 02/18/23  4098     History  Chief Complaint  Patient presents with   Chest Pain    Nyhla Hall is a 37 y.o. female.   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  The patient states that yesterday at work she developed left-sided sharp chest discomfort with associated mild shortness of breath.  Symptoms have since resolved.  She denies any fevers, chills, cough.  She denies any history of DVT or PE.  She denies any estrogen containing contraceptive use.  She denies any lower extremity swelling or cramping.  No recent travel.  She was primarily worried about her heart and presented to the emergency department for evaluation.  Home Medications Prior to Admission medications   Medication Sig Start Date End Date Taking? Authorizing Provider  diphenhydrAMINE (BENADRYL) 25 mg capsule Take 25 mg by mouth every 6 (six) hours as needed.    [provider]  hydrOXYzine (ATARAX) 25 MG tablet Take 1 tablet (25 mg total) by mouth every 6 (six) hours as needed for itching. 01/28/23   Garlon Hatchet, PA-C  predniSONE (DELTASONE) 20 MG tablet Take 40 mg by mouth daily for 3 days, then 20mg  by mouth daily for 3 days, then 10mg  daily for 3 days 01/28/23   Garlon Hatchet, PA-C      Allergies    Patient has no known allergies.    Review of Systems   Review of Systems  Cardiovascular:  Positive for chest pain.  All other systems reviewed and are negative.   Physical Exam Updated Vital Signs BP 113/77   Pulse 95   Temp 98.6 F (37 C) (Oral)   Resp 17   Ht 5\' 5"  (1.651 m)   Wt 86.2 kg   SpO2 98%   BMI 31.62 kg/m  Physical Exam Vitals and nursing note reviewed.   Constitutional:      General: She is not in acute distress.    Appearance: She is well-developed. She is obese. She is not ill-appearing.  HENT:     Head: Normocephalic and atraumatic.  Eyes:     Conjunctiva/sclera: Conjunctivae normal.  Cardiovascular:     Rate and Rhythm: Normal rate and regular rhythm.     Heart sounds: No murmur heard. Pulmonary:     Effort: Pulmonary effort is normal. No respiratory distress.     Breath sounds: Normal breath sounds.  Chest:     Comments: No reproducible chest wall tenderness to palpation, lungs CTAB Abdominal:     Palpations: Abdomen is soft.     Tenderness: There is no abdominal tenderness.  Musculoskeletal:        General: No swelling.     Cervical back: Neck supple.     Right lower leg: No edema.     Left lower leg: No edema.  Skin:    General: Skin is warm and dry.     Capillary Refill: Capillary refill takes less than 2 seconds.  Neurological:     Mental Status: She is alert.  Psychiatric:        Mood and Affect: Mood normal.     ED Results / Procedures / Treatments   Labs (all labs ordered are listed, but only  abnormal results are displayed) Labs Reviewed  COMPREHENSIVE METABOLIC PANEL - Abnormal; Notable for the following components:      Result Value   ALT 57 (*)    All other components within normal limits  SARS CORONAVIRUS 2 BY RT PCR  CBC WITH DIFFERENTIAL/PLATELET  D-DIMER, QUANTITATIVE  HCG, QUANTITATIVE, PREGNANCY  TROPONIN I (HIGH SENSITIVITY)  TROPONIN I (HIGH SENSITIVITY)    EKG EKG Interpretation Date/Time:  Tuesday February 18 2023 08:34:04 EDT Ventricular Rate:  102 PR Interval:  134 QRS Duration:  78 QT Interval:  352 QTC Calculation: 459 R Axis:   21  Text Interpretation: Sinus tachycardia Low voltage, precordial leads Borderline T abnormalities, anterior leads Confirmed by Ernie Avena ([id number]) on 02/18/2023 10:37:44 AM  Radiology DG Chest Portable 1 View  Result Date: 02/18/2023 CLINICAL DATA:   Chest pain EXAM: PORTABLE CHEST 1 VIEW COMPARISON:  None Available. FINDINGS: No consolidation, pneumothorax or effusion. No edema. Normal cardiopericardial silhouette. Overlapping cardiac leads. Slight curvature of the spine. IMPRESSION: No acute cardiopulmonary disease. Electronically Signed   By: Karen Kays M.D.   On: 02/18/2023 11:01    Procedures Procedures    Medications Ordered in ED Medications  ketorolac (TORADOL) 15 MG/ML injection 15 mg (15 mg Intravenous Given 02/18/23 1102)    ED Course/ Medical Decision Making/ A&P             HEART Score: 0                    Medical Decision Making Amount and/or Complexity of Data Reviewed Labs: ordered. Radiology: ordered.  Risk Prescription drug management.    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  The patient states that yesterday at work she developed left-sided sharp chest discomfort with associated mild shortness of breath.  Symptoms have since resolved.  She denies any fevers, chills, cough.  She denies any history of DVT or PE.  She denies any estrogen containing contraceptive use.  She denies any lower extremity swelling or cramping.  No recent travel.  She was primarily worried about her heart and presented to the emergency department for evaluation.  Medical Decision Making: Melody Hall is a 37 y.o. female who presented to the ED today with chest pain, detailed above.  Based on patient's comorbidities, patient has a heart score of 0.     Complete initial physical exam performed, notably the patient was CTAB, no specific MSK TTP, no LE swelling.   Reviewed and confirmed nursing documentation for past medical history, family history, social history.    Initial Assessment: With the patient's presentation of left-sided chest pain,  most likely diagnosis is musculoskeletal chest pain versus GERD, although ACS remains on the differential. Other diagnoses were considered including (but not limited to) pulmonary embolism, community-acquired pneumonia, aortic dissection, pneumothorax, underlying bony abnormality, anemia. These are considered less likely due to history of present illness and physical exam findings.    In particular, concerning pulmonary embolism: Patient is PERC positive for tachycardia and the they deny malignancy, recent surgery, history of DVT, or calf tenderness leading to a low risk Wells score. Aortic Dissection also reconsidered but seems less likely based on the location, quality, onset, and severity of symptoms in this case. Patient has a lack of serious comorbidities for this condition including a lack of HTN or Smoking. Patient  also has a lack of underlying history of AD or TAA.    Initial Plan: Evaluate for ACS with delta troponin and EKG evaluated as below  Evaluate for dissection, bony abnormality, or pneumonia with chest x-ray and screening laboratory evaluation including CBC, BMP  Further evaluation for pulmonary embolism indicated at this time based on patient's PERC and Wells score.  Further evaluation for Thoracic Aortic Dissection not indicated at this time based on patient's clinical history and PE findings.   Initial Study Results: EKG was reviewed independently. Rate, rhythm, axis, intervals all examined and without medically relevant abnormality. ST segments without concerns for elevations.    Laboratory  Delta  troponin demonstrated normal values.   CBC and BMP without obvious metabolic or inflammatory abnormalities requiring further evaluation   Dimer negative.  COVID PCR pending  Radiology  DG Chest Portable 1 View  Result Date: 02/18/2023 CLINICAL DATA:  Chest pain EXAM: PORTABLE CHEST 1 VIEW COMPARISON:  None Available. FINDINGS: No consolidation, pneumothorax or effusion. No  edema. Normal cardiopericardial silhouette. Overlapping cardiac leads. Slight curvature of the spine. IMPRESSION: No acute cardiopulmonary disease. Electronically Signed   By: Karen Kays M.D.   On: 02/18/2023 11:01    Final Assessment and Plan: Symptoms improved with Toradol, suspect likely musculoskeletal component of pain and discomfort versus pleuritis.  D-dimer negative, low concern for PE, delta troponins negative, EKG reassuring, revealed sinus tachycardia, rate 102, no concerning ST changes.  On repeat assessment, the patient was asymptomatic following Toradol administration, COVID PCR testing was pending.  The patient stated that she needed to get to another healthcare appointment.  I advised that she follow-up on the test results on the patient portal, plan for outpatient PCP follow-up for consideration for outpatient stress testing, can take NSAIDs for any recurrence of severe pain.  Stable for discharge.    Final Clinical Impression(s) / ED Diagnoses Final diagnoses:  Atypical chest pain  Pleuritis    Rx / DC Orders ED Discharge Orders     None         Ernie Avena, MD 02/18/23 1356

## 2023-02-19 ENCOUNTER — Emergency Department (HOSPITAL_COMMUNITY)
Admission: EM | Admit: 2023-02-19 | Discharge: 2023-02-19 | Disposition: A | Discharge status: Home / Self Care | Payer: BLUE CROSS/BLUE SHIELD | Attending: Emergency Medicine | Admitting: Emergency Medicine

## 2023-02-19 ENCOUNTER — Encounter (HOSPITAL_COMMUNITY): Payer: Self-pay

## 2023-02-19 ENCOUNTER — Other Ambulatory Visit: Payer: Self-pay

## 2023-02-19 DIAGNOSIS — Z760 Encounter for issue of repeat prescription: Secondary | ICD-10-CM | POA: Insufficient documentation

## 2023-02-19 DIAGNOSIS — L509 Urticaria, unspecified: Secondary | ICD-10-CM | POA: Insufficient documentation

## 2023-02-19 MED ORDER — HYDROXYZINE HCL 25 MG PO TABS: 25.0000 mg | ORAL_TABLET | Freq: Four times a day (QID) | ORAL | 0 refills | Status: DC | PRN

## 2023-02-19 MED ORDER — METHYLPREDNISOLONE 4 MG PO TBPK: ORAL_TABLET | ORAL | 0 refills | Status: DC

## 2023-02-19 NOTE — ED Provider Notes (Signed)
[place] EMERGENCY DEPARTMENT AT [facility] Provider Note   CSN: [id number] Arrival date & time: 02/19/23  1739     History  Chief Complaint  Patient presents with   Medication Refill    Angala Hanscom is a 37 y.o. female.  History of chronic and recurrent hives.  Patient has been taking hydroxyzine, Allegra, Benadryl.  She was seen for the same about 2 weeks ago and got some prednisone.  She took it as directed but returns today because her hives are worsening and she is having some swelling in her lips.  She denies any airway obstruction.  She denies any changes in lotions or soaps.  She has seen an allergist in the past and states that they told her she had chronic hives.  Medication Refill      Home Medications Prior to Admission medications   Medication Sig Start Date End Date Taking? Authorizing Provider  diphenhydrAMINE (BENADRYL) 25 mg capsule Take 25 mg by mouth every 6 (six) hours as needed.    [provider]  hydrOXYzine (ATARAX) 25 MG tablet Take 1 tablet (25 mg total) by mouth every 6 (six) hours as needed for itching. 01/28/23   Garlon Hatchet, PA-C  predniSONE (DELTASONE) 20 MG tablet Take 40 mg by mouth daily for 3 days, then 20mg  by mouth daily for 3 days, then 10mg  daily for 3 days 01/28/23   Garlon Hatchet, PA-C      Allergies    Patient has no known allergies.    Review of Systems   Review of Systems  Physical Exam Updated Vital Signs BP (!) 137/108 (BP Location: Left Arm)   Pulse 100   Temp 98.7 F (37.1 C) (Oral)   Resp 16   Ht 5\' 5"  (1.651 m)   Wt 86 kg   SpO2 100%   BMI 31.55 kg/m  Physical Exam Vitals and nursing note reviewed.  Constitutional:      General: She is not in acute distress.    Appearance: She is well-developed. She is not diaphoretic.  HENT:     Head: Normocephalic and atraumatic.     Comments: Mild swelling of the lips, no change in voice, no wheezing, no sublingual swelling noted.  Hives on the  arms.    Right Ear: External ear normal.     Left Ear: External ear normal.     Nose: Nose normal.     Mouth/Throat:     Mouth: Mucous membranes are moist.  Eyes:     General: No scleral icterus.    Conjunctiva/sclera: Conjunctivae normal.  Cardiovascular:     Rate and Rhythm: Normal rate and regular rhythm.     Heart sounds: Normal heart sounds. No murmur heard.    No friction rub. No gallop.  Pulmonary:     Effort: Pulmonary effort is normal. No respiratory distress.     Breath sounds: Normal breath sounds.  Abdominal:     General: Bowel sounds are normal. There is no distension.     Palpations: Abdomen is soft. There is no mass.     Tenderness: There is no abdominal tenderness. There is no guarding.  Musculoskeletal:     Cervical back: Normal range of motion.  Skin:    General: Skin is warm and dry.  Neurological:     Mental Status: She is alert and oriented to person, place, and time.  Psychiatric:        Behavior: Behavior normal.  ED Results / Procedures / Treatments   Labs (all labs ordered are listed, but only abnormal results are displayed) Labs Reviewed - No data to display  EKG None  Radiology DG Chest Portable 1 View  Result Date: 02/18/2023 CLINICAL DATA:  Chest pain EXAM: PORTABLE CHEST 1 VIEW COMPARISON:  None Available. FINDINGS: No consolidation, pneumothorax or effusion. No edema. Normal cardiopericardial silhouette. Overlapping cardiac leads. Slight curvature of the spine. IMPRESSION: No acute cardiopulmonary disease. Electronically Signed   By: Karen Kays M.D.   On: 02/18/2023 11:01    Procedures Procedures    Medications Ordered in ED Medications - No data to display  ED Course/ Medical Decision Making/ A&P              HEART Score: 0                    Medical Decision Making  Here with a request for short course of steroid.  She reports that her hives are chronic and she is extremely uncomfortable she has a an appointment with her  PCP on the 23rd.  I explained risks versus benefits of taking this medication she understands that she could have severe side effects including psychosis, diabetes, suppression of her adrenal gland function.  She understands that these can lead to short and long-term problems.  Will give a short course of Medrol and have her follow-up in the outpatient setting.        Final Clinical Impression(s) / ED Diagnoses Final diagnoses:  Hives    Rx / DC Orders ED Discharge Orders     None         Arthor Captain, PA-C 02/19/23 1821    Glyn Ade, MD 02/19/23 2308

## 2023-02-19 NOTE — ED Triage Notes (Signed)
Pt c/o rash and itching for 4 months. States she was diagnosed with chronic hives. Pt has been taking benadryl and allegra for sx. Pt would like a refill of hydroxyzine and prednisone.

## 2023-02-19 NOTE — Discharge Instructions (Addendum)
Follow-up with your primary care physician for any further medication needs. Get help right away if you have severe swelling in your mouth tongue or throat, wheezing or change in voice.  If you do please call 911 as this is a medical emergency.

## 2023-02-20 ENCOUNTER — Ambulatory Visit: Payer: BLUE CROSS/BLUE SHIELD | Admitting: Surgery

## 2023-02-20 ENCOUNTER — Ambulatory Visit: Payer: Medicaid Other

## 2023-02-25 ENCOUNTER — Ambulatory Visit: Payer: BLUE CROSS/BLUE SHIELD | Admitting: Surgery

## 2023-02-25 ENCOUNTER — Encounter: Payer: Self-pay | Admitting: Surgery

## 2023-02-25 ENCOUNTER — Ambulatory Visit: Payer: Self-pay | Admitting: Surgery

## 2023-02-25 VITALS — BP 143/94 | HR 96 | Temp 98.0°F | Ht 64.0 in | Wt 199.0 lb

## 2023-02-25 DIAGNOSIS — N6489 Other specified disorders of breast: Secondary | ICD-10-CM

## 2023-02-25 DIAGNOSIS — N6082 Other benign mammary dysplasias of left breast: Secondary | ICD-10-CM

## 2023-02-25 NOTE — Progress Notes (Signed)
Patient ID: Melody Hall, female   DOB: [birth year], 37 y.o.   MRN: [id number]  Chief Complaint:  FEA/radial scar, left breast.   History of Present Illness She began developing hives, and discontinued taking Accutane.  She has undergone trials of oral steroids, and even progressing onto an injectable to attempt to control these highs of uncertain etiology.  For this she has deferred following through with the intended RFID tagged lumpectomy.  She now presents ready to consider proceeding with surgery, she is search through [employer] and presents with some questions.  Seen last March where  Melody Hall is a 37 y.o. 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  She feels it has grown somewhat larger though that is not why she is here today.  She has had recent stereotactic biopsy of an area of distortion, and ultrasound-guided biopsies of the most concerning of masses. She is currently taking birth control.  She has no family history of breast cancer.  She began menstruating at the age of 39.  She is gravida 2 para 2.  She is 19 with her first pregnancy.  She did not breast-feed.  She has known palpable breast lumps, most notably upper right breast.  No history of nipple discharge or dermal changes.  She denies any history of breast pain.  Past Medical History Past Medical History:  Diagnosis Date   Anemia    2016   Breast lesion 07/31/2018   Depression    Elevated blood pressure reading    Family history of adverse reaction to anesthesia    Hypertension    Mental disorder    Depression      Past Surgical History:  Procedure Laterality Date   BREAST BIOPSY Left 08/14/2022   Stereo Bx coil clip - path pending   BREAST BIOPSY Left 08/14/2022   [place] LT BREAST BX W LOC DEV 1ST LESION IMG BX SPEC US GUIDE 08/14/2022 [facility]   BREAST BIOPSY Left 08/14/2022   MM LT BREAST BX W LOC DEV 1ST LESION  IMAGE BX SPEC STEREO GUIDE 08/14/2022 [facility]   BREAST BIOPSY Right 08/28/2022   u/s bx, 1:00 RIBBON clip-path pending   BREAST BIOPSY Left 08/28/2022   u/s bx, 9:00  RIBBON clip-path pending   BREAST BIOPSY Left 08/28/2022   [place] LT BREAST BX W LOC DEV 1ST LESION IMG BX SPEC US GUIDE 08/28/2022 [facility]   BREAST BIOPSY Right 08/28/2022   [place] RT BREAST BX W LOC DEV 1ST LESION IMG BX SPEC US GUIDE 08/28/2022 [facility]   BREAST LUMPECTOMY WITH RADIOFREQUENCY TAG IDENTIFICATION Left 10/30/2022   RF tag for coil clip CSL   NO PAST SURGERIES      No Known Allergies   Current Outpatient Medications  Medication Sig Dispense Refill   predniSONE (DELTASONE) 20 MG tablet Take 40 mg by mouth daily for 3 days, then 20mg  by mouth daily for 3 days, then 10mg  daily for 3 days 12 tablet 0   diphenhydrAMINE (BENADRYL) 25 mg capsule Take 25 mg by mouth every 6 (six) hours as needed. (Patient not taking: Reported on 02/25/2023)     hydrOXYzine (ATARAX) 25 MG tablet Take 1 tablet (25 mg total) by mouth every 6 (six) hours as needed for itching. (Patient not taking: Reported on 02/25/2023) 12 tablet 0   methylPREDNISolone (MEDROL DOSEPAK) 4 MG TBPK tablet Use as directed (Patient not taking: Reported on 02/25/2023) 21 tablet  0   Current Facility-Administered Medications  Medication Dose Route Frequency Provider Last Rate Last Admin   medroxyPROGESTERone (DEPO-PROVERA) injection 150 mg  150 mg Intramuscular Q90 days Lenice Llamas, FNP   150 mg at 11/13/22 1707    Family History Family History  Problem Relation Age of Onset   Healthy Paternal Grandfather    Healthy Paternal Grandmother    Healthy Maternal Grandmother    Healthy Maternal Grandfather    Healthy Father    Healthy Mother       Social History Social History   Tobacco Use   Smoking status: Never    Passive exposure: Never   Smokeless tobacco: Never  Vaping Use   Vaping status: Never Used  Substance Use Topics    Alcohol use: Yes    Alcohol/week: 2.0 standard drinks of alcohol    Types: 2 Glasses of wine per week    Comment: occ   Drug use: Not Currently    Types: Marijuana        Review of Systems  Constitutional: Negative.   HENT: Negative.    Eyes: Negative.   Respiratory: Negative.    Cardiovascular: Negative.   Gastrointestinal: Negative.   Genitourinary: Negative.   Skin: Negative.   Neurological: Negative.   Psychiatric/Behavioral: Negative.       Physical Exam Blood pressure (!) 143/94, pulse 96, temperature 98 F (36.7 C), height 5\' 4"  (1.626 m), weight 199 lb (90.3 kg), last menstrual period 02/17/2023, SpO2 99%. Last Weight  Most recent update: 02/25/2023  9:10 AM    Weight  90.3 kg (199 lb)              CONSTITUTIONAL: Well developed, and nourished, appropriately responsive and aware without distress.   EYES: Sclera non-icteric.   EARS, NOSE, MOUTH AND THROAT:  The oropharynx is clear. Oral mucosa is pink and moist.     Hearing is intact to voice.  NECK: Trachea is midline, and there is no jugular venous distension.  LYMPH NODES:  Lymph nodes in the neck are not appreciated. RESPIRATORY:  Lungs are clear, and breath sounds are equal bilaterally.   Normal respiratory effort without pathologic use of accessory muscles. CARDIOVASCULAR: Heart is regular in rate and rhythm.   Well perfused.  GI: The abdomen is  soft, nontender, and nondistended.  GU: Melody Hall is present as chaperone.  There is a very large prominent right upper breast mass that is visible from a distance.  There are other smaller masses in both breasts consistent with benign fibroadenomas. MUSCULOSKELETAL:  Symmetrical muscle tone appreciated in all four extremities.    SKIN: Skin turgor is normal. No pathologic skin lesions appreciated.  NEUROLOGIC:  Motor and sensation appear grossly normal.  Cranial nerves are grossly without defect. PSYCH:  Alert and oriented to person, place and time. Affect is  appropriate for situation.  Data Reviewed I have personally reviewed what is currently available of the patient's imaging, recent labs and medical records.   Labs:     Latest Ref Rng & Units 02/18/2023    9:39 AM 12/06/2022   12:38 PM 11/13/2022    2:05 PM  CBC  WBC 4.0 - 10.5 K/uL 9.7  6.0  6.6   Hemoglobin 12.0 - 15.0 g/dL 95.6  21.3  08.6   Hematocrit 36.0 - 46.0 % 37.4  40.1  35.6   Platelets 150 - 400 K/uL 302  328  284       Latest Ref Rng & Units  02/18/2023    9:39 AM 12/06/2022   12:38 PM  CMP  Glucose 70 - 99 mg/dL 93  94   BUN 6 - 20 mg/dL 17  11   Creatinine 2.95 - 1.00 mg/dL 2.84  1.32   Sodium 440 - 145 mmol/L 137  138   Potassium 3.5 - 5.1 mmol/L 3.5  3.8   Chloride 98 - 111 mmol/L 106  107   CO2 22 - 32 mmol/L 23  23   Calcium 8.9 - 10.3 mg/dL 9.4  9.6   Total Protein 6.5 - 8.1 g/dL 7.4    Total Bilirubin 0.3 - 1.2 mg/dL 0.4    Alkaline Phos 38 - 126 U/L 81    AST 15 - 41 U/L 25    ALT 0 - 44 U/L 57     SURGICAL PATHOLOGY  CASE: [id number]  PATIENT: Melody Hall  Surgical Pathology Report   Specimen Submitted:  A. Breast, right  B. Breast, left   Clinical History: 37 YO F with 1) 3.1 cm upper inner right breast mass -  1:00 position, 2) 4.4 cm inner left breast mass - 9:00 position. A -  Ribbon-shaped clip placed following ultrasound guided biopsy of RIGHT  breast at 1 o'clock. B - Ribbon-shaped clip placed following ultrasound  guided biopsy of LEFT breast at 9 o'clock.   DIAGNOSIS:  A. BREAST, RIGHT AT 1:00, 8 CM FROM THE NIPPLE; ULTRASOUND-GUIDED CORE  NEEDLE BIOPSY:  - FRAGMENTS OF BENIGN FIBROADENOMA.  - NEGATIVE FOR ATYPICAL PROLIFERATIVE BREAST DISEASE.   B. BREAST, LEFT AT 9:00, 3 CM FROM THE NIPPLE; ULTRASOUND-GUIDED CORE  NEEDLE BIOPSY:  - FRAGMENTS OF BENIGN FIBROADENOMA, WITH FOCAL USUAL DUCTAL HYPERPLASIA.  - NEGATIVE FOR ATYPICAL PROLIFERATIVE BREAST DISEASE.    SURGICAL PATHOLOGY  CASE: [id number]  PATIENT: Melody Hall   Surgical Pathology Report   Specimen Submitted:  A. Breast, left, 3:00  B. Breast, left UIQ   Clinical History: Mass.  R/O fibroadenoma vs IMC.  Distortion.  R/O IMC.  A - Heart-shaped clip placed following ultrasound guided biopsy of LEFT  breast at 3 o'clock. B - Coil-shaped clip placed following stereotactic  biopsy of LEFT breast, upper inner quadrant.   DIAGNOSIS:  A. BREAST, LEFT AT 3:00, 4 CM FROM THE NIPPLE; ULTRASOUND-GUIDED CORE  NEEDLE BIOPSY:  - FRAGMENTS OF BENIGN FIBROADENOMA, WITH ASSOCIATED DYSTROPHIC  CALCIFICATIONS.  - NEGATIVE FOR ATYPICAL PROLIFERATIVE BREAST DISEASE.   B. BREAST, LEFT UPPER INNER QUADRANT; STEREOTACTIC CORE NEEDLE BIOPSY:  - FLAT EPITHELIAL ATYPIA.  - BACKGROUND CHANGES SUGGESTIVE OF COMPLEX SCLEROSING LESION/RADIAL  SCAR, WITH ASSOCIATED COLUMNAR CELL CHANGE.  - NEGATIVE FOR CARCINOMA IN SITU AND MALIGNANCY.   Imaging: Radiological images reviewed:   CLINICAL DATA:  Palpable mass right breast.   EXAM: DIGITAL DIAGNOSTIC BILATERAL MAMMOGRAM WITH TOMOSYNTHESIS; ULTRASOUND LEFT BREAST LIMITED; ULTRASOUND RIGHT BREAST LIMITED   TECHNIQUE: Bilateral digital diagnostic mammography and breast tomosynthesis was performed.; Targeted ultrasound examination of the left breast was performed.; Targeted ultrasound examination of the right breast was performed   COMPARISON:  None.   ACR Breast Density Category b: There are scattered areas of fibroglandular density.   FINDINGS: Cc and MLO views of bilateral breasts, spot tangential right breast are submitted. There is a mass at the palpable area right breast 12 o'clock. A second mass is noted in the upper-outer quadrant right breast. There is a third mass in the medial slight lower right breast.   Images of the left breast demonstrate architectural distortion  in the medial probably 3 o'clock left breast. There is a mass in the subareolar lower inner quadrant left breast. There is a mass in  the posterior lower inner quadrant left breast. There is a mass in the upper-outer quadrant left breast. At least 2 central mid depth subareolar masses are identified.   Targeted ultrasound is performed, showing a 3.1 x 1.8 x 2.9 cm oval macrolobulated hypoechoic mass at right breast 1 o'clock 8 cm nipple. At the right breast 2 o'clock 10 cm from nipple, there is a 2.4 x 0.9 x 1.6 cm oval hypoechoic mass. At right breast 10 o'clock 10 cm nipple, there is a 3.8 x 2.4 x 3.8 cm macro lobulated hypoechoic mass. These correlate to the mammographic masses. Ultrasound of the right axilla is negative.   Ultrasound of the left breast demonstrate a 4.4 x 1.3 x 2.9 cm oval heterogeneous hypoechoic lesion at the left breast 9 o'clock 3 cm from nipple correlating to the mass in the subareolar lower inner quadrant left breast. There are no focal discrete lesion identified that would correlate to the architectural distortion seen mammogram in the medial left breast. At the left breast 3 o'clock 4 cm from nipple, there is slight lobulated mixed echotexture measuring 0.8 x 0.5 x 0.6 cm. At the left breast 1 o'clock 2 cm from nipple, there is a 0.9 x 0.5 x 0.8 cm oval hypoechoic mass. At the left breast 8 o'clock subareolar region, there is 1.1 x 0.6 x 1 cm oval hypoechoic mass. Ultrasound of the left axilla is negative.   IMPRESSION: Highly suspicious findings.   RECOMMENDATION: Recommend stereotactic core biopsy of the architectural distortion in the medial left breast. Also recommend ultrasound-guided core biopsy of the left breast 3 o'clock mass. If either one of these biopsies is positive for cancer, consider ultrasound-guided core biopsy of the 4 cm mass at left breast 9 o'clock and the largest mass in right breast at 1 o'clock.   I have discussed the findings and recommendations with the patient. If applicable, a reminder letter will be sent to the patient regarding the next appointment.    BI-RADS CATEGORY  5: Highly suggestive of malignancy.     Electronically Signed   By: Sherian Rein M.D.   On: 06/21/2022 16:42  CLINICAL DATA:  Palpable mass right breast.   EXAM: DIGITAL DIAGNOSTIC BILATERAL MAMMOGRAM WITH TOMOSYNTHESIS; ULTRASOUND LEFT BREAST LIMITED; ULTRASOUND RIGHT BREAST LIMITED   TECHNIQUE: Bilateral digital diagnostic mammography and breast tomosynthesis was performed.; Targeted ultrasound examination of the left breast was performed.; Targeted ultrasound examination of the right breast was performed   COMPARISON:  None.   ACR Breast Density Category b: There are scattered areas of fibroglandular density.   FINDINGS: Cc and MLO views of bilateral breasts, spot tangential right breast are submitted. There is a mass at the palpable area right breast 12 o'clock. A second mass is noted in the upper-outer quadrant right breast. There is a third mass in the medial slight lower right breast.   Images of the left breast demonstrate architectural distortion in the medial probably 3 o'clock left breast. There is a mass in the subareolar lower inner quadrant left breast. There is a mass in the posterior lower inner quadrant left breast. There is a mass in the upper-outer quadrant left breast. At least 2 central mid depth subareolar masses are identified.   Targeted ultrasound is performed, showing a 3.1 x 1.8 x 2.9 cm oval macrolobulated hypoechoic mass at right breast  1 o'clock 8 cm nipple. At the right breast 2 o'clock 10 cm from nipple, there is a 2.4 x 0.9 x 1.6 cm oval hypoechoic mass. At right breast 10 o'clock 10 cm nipple, there is a 3.8 x 2.4 x 3.8 cm macro lobulated hypoechoic mass. These correlate to the mammographic masses. Ultrasound of the right axilla is negative.   Ultrasound of the left breast demonstrate a 4.4 x 1.3 x 2.9 cm oval heterogeneous hypoechoic lesion at the left breast 9 o'clock 3 cm from nipple correlating to the mass in  the subareolar lower inner quadrant left breast. There are no focal discrete lesion identified that would correlate to the architectural distortion seen mammogram in the medial left breast. At the left breast 3 o'clock 4 cm from nipple, there is slight lobulated mixed echotexture measuring 0.8 x 0.5 x 0.6 cm. At the left breast 1 o'clock 2 cm from nipple, there is a 0.9 x 0.5 x 0.8 cm oval hypoechoic mass. At the left breast 8 o'clock subareolar region, there is 1.1 x 0.6 x 1 cm oval hypoechoic mass. Ultrasound of the left axilla is negative.   IMPRESSION: Highly suspicious findings.   RECOMMENDATION: Recommend stereotactic core biopsy of the architectural distortion in the medial left breast. Also recommend ultrasound-guided core biopsy of the left breast 3 o'clock mass. If either one of these biopsies is positive for cancer, consider ultrasound-guided core biopsy of the 4 cm mass at left breast 9 o'clock and the largest mass in right breast at 1 o'clock.   I have discussed the findings and recommendations with the patient. If applicable, a reminder letter will be sent to the patient regarding the next appointment.   BI-RADS CATEGORY  5: Highly suggestive of malignancy.     Electronically Signed   By: Sherian Rein M.D.   On: 06/21/2022 16:42 Within last 24 hrs: No results found.  Assessment    Radial scar/complex sclerosing lesion, flat epithelial atypia, upper inner quadrant left breast. Benign fibroadenomas. Patient Active Problem List   Diagnosis Date Noted   Complex sclerosing lesion of left breast 10/11/2022   Bilateral fibroadenomas of breasts 10/11/2022   Elevated blood pressure reading 08/26/2022   Obesity without serious comorbidity BMI=32.0 04/20/2020   Positive depression screening 04/20/2020   Breast lesion 07/31/2018    Plan    Tag localized excisional biopsy of the left breast lesion.   Risks of proceeding with breast biopsy discussed in detail with  patient these include but not limited to anesthesia, bleeding, infection, need for additional procedures, need for follow-up imaging regardless of pathology.    She understands that if she should desire we could proceed with lumpectomy from the right breast as it is quite prominent.  Otherwise her questions have been adequately answered, no guarantees were ever expressed or implied.  Face-to-face time spent with the patient and accompanying care providers(if present) was 30 minutes, with more than 50% of the time spent counseling, educating, and coordinating care of the patient.    These notes generated with voice recognition software. I apologize for typographical errors.  Campbell Lerner M.D., FACS 02/25/2023, 10:14 AM

## 2023-02-25 NOTE — Patient Instructions (Signed)
We have spoken today about removing a lump in your breast. This will be done by Dr. Claudine Mouton at [facility].  You will most likely be able to leave the hospital several hours after your surgery. Rarely, a patient needs to stay over night but this is a possibility.  Plan to tentatively be off work for 1-2 weeks following the surgery and may return with approximately 2 more weeks of a lifting restriction, no greater than 15 lbs.  Please see your Blue surgery sheet for more information. Our surgery scheduler will call you to look at surgery dates and to go over information.   If you have FMLA or Disability paperwork that needs to be filled out, please have your company fax your paperwork to [phone] or you may drop this by either office. This paperwork will be filled out within 3 days after your surgery has been completed.    Lumpectomy A lumpectomy is a form of "breast conserving" or "breast preservation" surgery. It may also be referred to as a partial mastectomy. During a lumpectomy, the portion of the breast that contains the cancerous tumor or breast mass (the lump) is removed. Some normal tissue around the lump may also be removed to make sure all of the tumor has been removed.  LET [facility] CARE PROVIDER KNOW ABOUT: Any allergies you have. All medicines you are taking, including vitamins, herbs, eye drops, creams, and over-the-counter medicines. Previous problems you or members of your family have had with the use of anesthetics. Any blood disorders you have. Previous surgeries you have had. Medical conditions you have. RISKS AND COMPLICATIONS Generally, this is a safe procedure. However, problems can occur and include: Bleeding. Infection. Pain. Temporary swelling. Change in the shape of the breast, particularly if a large portion is removed. BEFORE THE PROCEDURE Ask your health care provider about changing or stopping your regular medicines. This is especially important if you  are taking diabetes medicines or blood thinners. Do not eat or drink anything after midnight on the night before the procedure or as directed by your health care provider. Ask your health care provider if you can take a sip of water with any approved medicines. On the day of surgery, your health care provider will use a mammogram or ultrasound to locate and mark the tumor in your breast. These markings on your breast will show where the cut (incision) will be made. PROCEDURE  An IV tube will be put into one of your veins. You may be given medicine to help you relax before the surgery (sedative). You will be given one of the following: A medicine that numbs the area (local anesthetic). A medicine that makes you fall asleep (general anesthetic). Your health care provider will use a kind of electric scalpel that uses heat to minimize bleeding (electrocautery knife). A curved incision (like a smile or frown) that follows the natural curve of your breast is made, to allow for minimal scarring and better healing. The tumor will be removed with some of the surrounding tissue. This will be sent to the lab for analysis. Your health care provider may also remove your lymph nodes at this time if needed. Sometimes, but not always, a rubber tube called a drain will be surgically inserted into your breast area or armpit to collect excess fluid that may accumulate in the space where the tumor was. This drain is connected to a plastic bulb on the outside of your body. This drain creates suction to help remove  the fluid. The incisions will be closed with stitches (sutures). A bandage may be placed over the incisions. AFTER THE PROCEDURE You will be taken to the recovery area. You will be given medicine for pain. A small rubber drain may be placed in the breast for 2-3 days to prevent a collection of blood (hematoma) from developing in the breast. You will be given instructions on caring for the drain before you go  home. A pressure bandage (dressing) will be applied for 1-2 days to prevent bleeding. Ask your health care provider how to care for your bandage at home.   This information is not intended to replace advice given to you by your health care provider. Make sure you discuss any questions you have with your health care provider.   Document Released: 08/12/2006 Document Revised: 07/22/2014 Document Reviewed: 12/04/2012 Elsevier Interactive Patient Education [employer]

## 2023-02-26 ENCOUNTER — Telehealth: Payer: Self-pay | Admitting: Surgery

## 2023-02-26 NOTE — Telephone Encounter (Signed)
Left message for patient to call, please inform her of the following regarding scheduled surgery with Dr. Claudine Mouton.    Pre-Admission date/time, and Surgery date at [facility].  Surgery Date: 03/05/23 Preadmission Testing Date: 02/28/23 (phone 8a-1p)  Also patient will need to call at [phone], between 1-3:00pm the day before surgery, to find out what time to arrive for surgery.

## 2023-02-28 ENCOUNTER — Other Ambulatory Visit: Payer: Self-pay

## 2023-02-28 ENCOUNTER — Encounter: Admission: RE | Admit: 2023-02-28 | Payer: BLUE CROSS/BLUE SHIELD | Source: Ambulatory Visit

## 2023-02-28 DIAGNOSIS — Z01812 Encounter for preprocedural laboratory examination: Secondary | ICD-10-CM

## 2023-02-28 HISTORY — DX: Urticaria, unspecified: L50.9

## 2023-02-28 HISTORY — DX: Fibroadenosis of unspecified breast: N60.29

## 2023-02-28 NOTE — Telephone Encounter (Signed)
Patient finally calls back, she is now informed of all dates regarding her surgery.

## 2023-02-28 NOTE — Patient Instructions (Signed)
Your procedure is scheduled on: 03/05/23 - Wednesday Report to the Registration Desk on the 1st floor of the [facility]. To find out your arrival time, please call [phone] between 1PM - 3PM on: 03/04/23 - Tuesday If your arrival time is 6:00 am, do not arrive before that time as the [facility] entrance doors do not open until 6:00 am.  REMEMBER: Instructions that are not followed completely may result in serious medical risk, up to and including death; or upon the discretion of your surgeon and anesthesiologist your surgery may need to be rescheduled.  Do not eat food or drink and liquids after midnight the night before surgery.  No gum chewing or hard candies.  One week prior to surgery: Stop Anti-inflammatories (NSAIDS) such as Advil, Aleve, Ibuprofen, Motrin, Naproxen, Naprosyn and Aspirin based products such as Excedrin, Goody's Powder, BC Powder.  Stop ANY OVER THE COUNTER supplements until after surgery.  You may take Tylenol if needed for pain up until the day of surgery.  TAKE ONLY THESE MEDICATIONS THE MORNING OF SURGERY WITH A SIP OF WATER:  none  No Alcohol for 24 hours before or after surgery.  No Smoking including e-cigarettes for 24 hours before surgery.  No chewable tobacco products for at least 6 hours before surgery.  No nicotine patches on the day of surgery.  Do not use any "recreational" drugs for at least a week (preferably 2 weeks) before your surgery.  Please be advised that the combination of cocaine and anesthesia may have negative outcomes, up to and including death. If you test positive for cocaine, your surgery will be cancelled.  On the morning of surgery brush your teeth with toothpaste and water, you may rinse your mouth with mouthwash if you wish. Do not swallow any toothpaste or mouthwash.  Use CHG Soap or wipes as directed on instruction sheet.  Do not wear jewelry, make-up, hairpins, clips or nail polish.  Do not wear lotions,  powders, or perfumes.   Do not shave body hair from the neck down 48 hours before surgery.  Contact lenses, hearing aids and dentures may not be worn into surgery.  Do not bring valuables to the hospital. [facility] is not responsible for any missing/lost belongings or valuables.   Notify your doctor if there is any change in your medical condition (cold, fever, infection).  Wear comfortable clothing (specific to your surgery type) to the hospital.  After surgery, you can help prevent lung complications by doing breathing exercises.  Take deep breaths and cough every 1-2 hours. Your doctor may order a device called an Incentive Spirometer to help you take deep breaths. When coughing or sneezing, hold a pillow firmly against your incision with both hands. This is called "splinting." Doing this helps protect your incision. It also decreases belly discomfort.  If you are being admitted to the hospital overnight, leave your suitcase in the car. After surgery it may be brought to your room.  In case of increased patient census, it may be necessary for you, the patient, to continue your postoperative care in the Same Day Surgery department.  If you are being discharged the day of surgery, you will not be allowed to drive home. You will need a responsible individual to drive you home and stay with you for 24 hours after surgery.   If you are taking public transportation, you will need to have a responsible individual with you.  Please call the Pre-admissions Testing Dept. at [phone]  if you have any questions about these instructions.  Surgery Visitation Policy:  Patients having surgery or a procedure may have two visitors.  Children under the age of 54 must have an adult with them who is not the patient.  Inpatient Visitation:    Visiting hours are 7 a.m. to 8 p.m. Up to four visitors are allowed at one time in a patient room. The visitors may rotate out with other people during  the day.  One visitor age 15 or older may stay with the patient overnight and must be in the room by 8 p.m.    Preparing for Surgery with CHLORHEXIDINE GLUCONATE (CHG) Soap  Chlorhexidine Gluconate (CHG) Soap  o An antiseptic cleaner that kills germs and bonds with the skin to continue killing germs even after washing  o Used for showering the night before surgery and morning of surgery  Before surgery, you can play an important role by reducing the number of germs on your skin.  CHG (Chlorhexidine gluconate) soap is an antiseptic cleanser which kills germs and bonds with the skin to continue killing germs even after washing.  Please do not use if you have an allergy to CHG or antibacterial soaps. If your skin becomes reddened/irritated stop using the CHG.  1. Shower the NIGHT BEFORE SURGERY and the MORNING OF SURGERY with CHG soap.  2. If you choose to wash your hair, wash your hair first as usual with your normal shampoo.  3. After shampooing, rinse your hair and body thoroughly to remove the shampoo.  4. Use CHG as you would any other liquid soap. You can apply CHG directly to the skin and wash gently with a scrungie or a clean washcloth.  5. Apply the CHG soap to your body only from the neck down. Do not use on open wounds or open sores. Avoid contact with your eyes, ears, mouth, and genitals (private parts). Wash face and genitals (private parts) with your normal soap.  6. Wash thoroughly, paying special attention to the area where your surgery will be performed.  7. Thoroughly rinse your body with warm water.  8. Do not shower/wash with your normal soap after using and rinsing off the CHG soap.  9. Pat yourself dry with a clean towel.  10. Wear clean pajamas to bed the night before surgery.  12. Place clean sheets on your bed the night of your first shower and do not sleep with pets.  13. Shower again with the CHG soap on the day of surgery prior to arriving at the  hospital.  14. Do not apply any deodorants/lotions/powders.  15. Please wear clean clothes to the hospital.

## 2023-02-28 NOTE — Telephone Encounter (Signed)
Outgoing call again, another message is left for patient to call the office.

## 2023-03-03 ENCOUNTER — Ambulatory Visit: Payer: Medicaid Other

## 2023-03-03 ENCOUNTER — Telehealth: Payer: Self-pay | Admitting: Surgery

## 2023-03-03 NOTE — Telephone Encounter (Signed)
Due to patient having [employer], [facility] is out of network with this insurance.  Patient does have [employer] as a secondary payor, but since primary plan won't cover for services, her medicaid will not cover either.  Also called the [facility] program and there is no coverage for surgery through this program as well. The [facility]Service Center requesting patient to pay $13,716.00 prior to services being rendered.  After multiple attempts, finally was able to reach the patient, she has been made aware of this. States she can't pay the above amount. Surgery is cancelled.  Patient will be looking for providers with [facility] with her primary insurance plan.

## 2023-03-05 ENCOUNTER — Ambulatory Visit: Admission: RE | Admit: 2023-03-05 | Payer: BLUE CROSS/BLUE SHIELD | Source: Home / Self Care | Admitting: Surgery

## 2023-03-05 ENCOUNTER — Encounter: Admission: RE | Payer: Self-pay | Source: Home / Self Care

## 2023-03-05 SURGERY — BREAST BIOPSY WITH RADIO FREQUENCY LOCALIZER
Anesthesia: General | Laterality: Left

## 2023-03-12 ENCOUNTER — Ambulatory Visit: Payer: Medicaid Other

## 2023-04-03 ENCOUNTER — Ambulatory Visit: Payer: Medicaid Other

## 2023-04-15 ENCOUNTER — Ambulatory Visit (INDEPENDENT_AMBULATORY_CARE_PROVIDER_SITE_OTHER): Payer: Medicaid Other | Admitting: Surgery

## 2023-04-15 ENCOUNTER — Ambulatory Visit: Payer: Self-pay | Admitting: Surgery

## 2023-04-15 ENCOUNTER — Encounter: Payer: Self-pay | Admitting: Surgery

## 2023-04-15 ENCOUNTER — Telehealth: Payer: Self-pay | Admitting: Surgery

## 2023-04-15 VITALS — BP 124/87 | HR 93 | Temp 98.6°F | Ht 64.0 in | Wt 199.6 lb

## 2023-04-15 DIAGNOSIS — N6489 Other specified disorders of breast: Secondary | ICD-10-CM | POA: Insufficient documentation

## 2023-04-15 DIAGNOSIS — D242 Benign neoplasm of left breast: Secondary | ICD-10-CM

## 2023-04-15 DIAGNOSIS — D241 Benign neoplasm of right breast: Secondary | ICD-10-CM

## 2023-04-15 DIAGNOSIS — N6082 Other benign mammary dysplasias of left breast: Secondary | ICD-10-CM

## 2023-04-15 NOTE — Progress Notes (Addendum)
Patient ID: Melody Hall, female   DOB: 1985-11-06, 37 y.o.   MRN: [id number]  Chief Complaint:  FEA/radial scar, left breast.   History of Present Illness She follows up today, wanting to proceed with surgery, between insurance issues and social issues regarding her ride we have finally seem to have all the ironed out and can anticipate proceeding with the previously planned excisional biopsy, and the desired right breast lumpectomy.  She has deferred following through with the intended RFID tagged excisional biopsy for the left breast lesion.  She now presents ready to proceed with surgery.   Melody Hall is a 37 y.o. 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  She feels it has grown somewhat larger though that is not why she is here today.  She has had stereotactic biopsy of an area of distortion, and ultrasound-guided biopsies of the most concerning of masses. She has taken birth control.  She has no family history of breast cancer.  She began menstruating at the age of 60.  She is gravida 2 para 2.  She is 19 with her first pregnancy.  She did not breast-feed.  She has known palpable breast lumps, most notably upper medial right breast.  No history of nipple discharge or dermal changes.  She denies any history of breast pain.  Past Medical History Past Medical History:  Diagnosis Date   Anemia    2016   Breast lesion 07/31/2018   Chronic idiopathic urticaria    Depression    Elevated blood pressure reading    Fibroadenosis of breast    Hives    Hypertension    Mental disorder    Depression      Past Surgical History:  Procedure Laterality Date   BREAST BIOPSY Left 08/14/2022   Stereo Bx coil clip - path pending   BREAST BIOPSY Left 08/14/2022   [place] LT BREAST BX W LOC DEV 1ST LESION IMG BX SPEC US GUIDE 08/14/2022 [facility]   BREAST BIOPSY Left 08/14/2022   MM LT BREAST BX W LOC  DEV 1ST LESION IMAGE BX SPEC STEREO GUIDE 08/14/2022 [facility]   BREAST BIOPSY Right 08/28/2022   u/s bx, 1:00 RIBBON clip-path pending   BREAST BIOPSY Left 08/28/2022   u/s bx, 9:00  RIBBON clip-path pending   BREAST BIOPSY Left 08/28/2022   [place] LT BREAST BX W LOC DEV 1ST LESION IMG BX SPEC US GUIDE 08/28/2022 [facility]   BREAST BIOPSY Right 08/28/2022   [place] RT BREAST BX W LOC DEV 1ST LESION IMG BX SPEC US GUIDE 08/28/2022 [facility]   BREAST LUMPECTOMY WITH RADIOFREQUENCY TAG IDENTIFICATION Left 10/30/2022   RF tag for coil clip CSL    No Known Allergies   Current Outpatient Medications  Medication Sig Dispense Refill   diphenhydrAMINE (BENADRYL) 25 mg capsule Take 25 mg by mouth every 6 (six) hours as needed.     ibuprofen (ADVIL) 400 MG tablet Take 400 mg by mouth every 6 (six) hours as needed.     Current Facility-Administered Medications  Medication Dose Route Frequency Provider Last Rate Last Admin   medroxyPROGESTERone (DEPO-PROVERA) injection 150 mg  150 mg Intramuscular Q90 days Lenice Llamas, FNP   150 mg at 11/13/22 1707    Family History Family History  Problem Relation Age of Onset   Healthy Paternal Grandfather    Healthy Paternal Grandmother    Healthy Maternal Grandmother  Healthy Maternal Grandfather    Healthy Father    Healthy Mother       Social History Social History   Tobacco Use   Smoking status: Never    Passive exposure: Never   Smokeless tobacco: Never  Vaping Use   Vaping status: Never Used  Substance Use Topics   Alcohol use: Yes    Alcohol/week: 2.0 standard drinks of alcohol    Types: 2 Glasses of wine per week    Comment: occ   Drug use: Not Currently    Types: Marijuana        Review of Systems  Constitutional: Negative.   HENT: Negative.    Eyes: Negative.   Respiratory: Negative.    Cardiovascular: Negative.   Gastrointestinal: Negative.   Genitourinary: Negative.   Skin: Negative.    Neurological: Negative.   Psychiatric/Behavioral: Negative.       Physical Exam Blood pressure 124/87, pulse 93, temperature 98.6 F (37 C), temperature source Oral, height 5\' 4"  (1.626 m), weight 199 lb 9.6 oz (90.5 kg), SpO2 97%. Last Weight  Most recent update: 04/15/2023 10:56 AM    Weight  90.5 kg (199 lb 9.6 oz)               CONSTITUTIONAL: Well developed, and nourished, appropriately responsive and aware without distress.   EYES: Sclera non-icteric.   EARS, NOSE, MOUTH AND THROAT:  The oropharynx is clear. Oral mucosa is pink and moist.     Hearing is intact to voice.  NECK: Trachea is midline, and there is no jugular venous distension.  LYMPH NODES:  Lymph nodes in the neck are not appreciated. RESPIRATORY:  Lungs are clear, and breath sounds are equal bilaterally.   Normal respiratory effort without pathologic use of accessory muscles. CARDIOVASCULAR: Heart is regular in rate and rhythm.   Well perfused.  GI: The abdomen is  soft, nontender, and nondistended.  GU: Melody Hall is present as chaperone.  There is a very large prominent right upper medial breast mass that is visible from a distance.  There are other smaller masses in both breasts consistent with benign fibroadenomas. MUSCULOSKELETAL:  Symmetrical muscle tone appreciated in all four extremities.    SKIN: Skin turgor is normal. No pathologic skin lesions appreciated.  NEUROLOGIC:  Motor and sensation appear grossly normal.  Cranial nerves are grossly without defect. PSYCH:  Alert and oriented to person, place and time. Affect is appropriate for situation.  Data Reviewed I have personally reviewed what is currently available of the patient's imaging, recent labs and medical records.   Labs:     Latest Ref Rng & Units 02/18/2023    9:39 AM 12/06/2022   12:38 PM 11/13/2022    2:05 PM  CBC  WBC 4.0 - 10.5 K/uL 9.7  6.0  6.6   Hemoglobin 12.0 - 15.0 g/dL 18.8  41.6  60.6   Hematocrit 36.0 - 46.0 % 37.4  40.1   35.6   Platelets 150 - 400 K/uL 302  328  284       Latest Ref Rng & Units 02/18/2023    9:39 AM 12/06/2022   12:38 PM  CMP  Glucose 70 - 99 mg/dL 93  94   BUN 6 - 20 mg/dL 17  11   Creatinine 3.01 - 1.00 mg/dL 6.01  0.93   Sodium 235 - 145 mmol/L 137  138   Potassium 3.5 - 5.1 mmol/L 3.5  3.8   Chloride 98 - 111 mmol/L  106  107   CO2 22 - 32 mmol/L 23  23   Calcium 8.9 - 10.3 mg/dL 9.4  9.6   Total Protein 6.5 - 8.1 g/dL 7.4    Total Bilirubin 0.3 - 1.2 mg/dL 0.4    Alkaline Phos 38 - 126 U/L 81    AST 15 - 41 U/L 25    ALT 0 - 44 U/L 57     SURGICAL PATHOLOGY  CASE: [id number]  PATIENT: Jaeliana Fedora  Surgical Pathology Report   Specimen Submitted:  A. Breast, right  B. Breast, left   Clinical History: 37 YO F with 1) 3.1 cm upper inner right breast mass -  1:00 position, 2) 4.4 cm inner left breast mass - 9:00 position. A -  Ribbon-shaped clip placed following ultrasound guided biopsy of RIGHT  breast at 1 o'clock. B - Ribbon-shaped clip placed following ultrasound  guided biopsy of LEFT breast at 9 o'clock.   DIAGNOSIS:  A. BREAST, RIGHT AT 1:00, 8 CM FROM THE NIPPLE; ULTRASOUND-GUIDED CORE  NEEDLE BIOPSY:  - FRAGMENTS OF BENIGN FIBROADENOMA.  - NEGATIVE FOR ATYPICAL PROLIFERATIVE BREAST DISEASE.   B. BREAST, LEFT AT 9:00, 3 CM FROM THE NIPPLE; ULTRASOUND-GUIDED CORE  NEEDLE BIOPSY:  - FRAGMENTS OF BENIGN FIBROADENOMA, WITH FOCAL USUAL DUCTAL HYPERPLASIA.  - NEGATIVE FOR ATYPICAL PROLIFERATIVE BREAST DISEASE.    SURGICAL PATHOLOGY  CASE: [id number]  PATIENT: Alisan Guimont  Surgical Pathology Report   Specimen Submitted:  A. Breast, left, 3:00  B. Breast, left UIQ   Clinical History: Mass.  R/O fibroadenoma vs IMC.  Distortion.  R/O IMC.  A - Heart-shaped clip placed following ultrasound guided biopsy of LEFT  breast at 3 o'clock. B - Coil-shaped clip placed following stereotactic  biopsy of LEFT breast, upper inner quadrant.   DIAGNOSIS:  A.  BREAST, LEFT AT 3:00, 4 CM FROM THE NIPPLE; ULTRASOUND-GUIDED CORE  NEEDLE BIOPSY:  - FRAGMENTS OF BENIGN FIBROADENOMA, WITH ASSOCIATED DYSTROPHIC  CALCIFICATIONS.  - NEGATIVE FOR ATYPICAL PROLIFERATIVE BREAST DISEASE.   B. BREAST, LEFT UPPER INNER QUADRANT; STEREOTACTIC CORE NEEDLE BIOPSY:  - FLAT EPITHELIAL ATYPIA.  - BACKGROUND CHANGES SUGGESTIVE OF COMPLEX SCLEROSING LESION/RADIAL  SCAR, WITH ASSOCIATED COLUMNAR CELL CHANGE.  - NEGATIVE FOR CARCINOMA IN SITU AND MALIGNANCY.   Imaging: Radiological images reviewed:   CLINICAL DATA:  Palpable mass right breast.   EXAM: DIGITAL DIAGNOSTIC BILATERAL MAMMOGRAM WITH TOMOSYNTHESIS; ULTRASOUND LEFT BREAST LIMITED; ULTRASOUND RIGHT BREAST LIMITED   TECHNIQUE: Bilateral digital diagnostic mammography and breast tomosynthesis was performed.; Targeted ultrasound examination of the left breast was performed.; Targeted ultrasound examination of the right breast was performed   COMPARISON:  None.   ACR Breast Density Category b: There are scattered areas of fibroglandular density.   FINDINGS: Cc and MLO views of bilateral breasts, spot tangential right breast are submitted. There is a mass at the palpable area right breast 12 o'clock. A second mass is noted in the upper-outer quadrant right breast. There is a third mass in the medial slight lower right breast.   Images of the left breast demonstrate architectural distortion in the medial probably 3 o'clock left breast. There is a mass in the subareolar lower inner quadrant left breast. There is a mass in the posterior lower inner quadrant left breast. There is a mass in the upper-outer quadrant left breast. At least 2 central mid depth subareolar masses are identified.   Targeted ultrasound is performed, showing a 3.1 x 1.8 x 2.9 cm oval macrolobulated hypoechoic  mass at right breast 1 o'clock 8 cm nipple. At the right breast 2 o'clock 10 cm from nipple, there is a 2.4 x 0.9 x  1.6 cm oval hypoechoic mass. At right breast 10 o'clock 10 cm nipple, there is a 3.8 x 2.4 x 3.8 cm macro lobulated hypoechoic mass. These correlate to the mammographic masses. Ultrasound of the right axilla is negative.   Ultrasound of the left breast demonstrate a 4.4 x 1.3 x 2.9 cm oval heterogeneous hypoechoic lesion at the left breast 9 o'clock 3 cm from nipple correlating to the mass in the subareolar lower inner quadrant left breast. There are no focal discrete lesion identified that would correlate to the architectural distortion seen mammogram in the medial left breast. At the left breast 3 o'clock 4 cm from nipple, there is slight lobulated mixed echotexture measuring 0.8 x 0.5 x 0.6 cm. At the left breast 1 o'clock 2 cm from nipple, there is a 0.9 x 0.5 x 0.8 cm oval hypoechoic mass. At the left breast 8 o'clock subareolar region, there is 1.1 x 0.6 x 1 cm oval hypoechoic mass. Ultrasound of the left axilla is negative.   IMPRESSION: Highly suspicious findings.   RECOMMENDATION: Recommend stereotactic core biopsy of the architectural distortion in the medial left breast. Also recommend ultrasound-guided core biopsy of the left breast 3 o'clock mass. If either one of these biopsies is positive for cancer, consider ultrasound-guided core biopsy of the 4 cm mass at left breast 9 o'clock and the largest mass in right breast at 1 o'clock.   I have discussed the findings and recommendations with the patient. If applicable, a reminder letter will be sent to the patient regarding the next appointment.   BI-RADS CATEGORY  5: Highly suggestive of malignancy.     Electronically Signed   By: Sherian Rein M.D.   On: 06/21/2022 16:42  CLINICAL DATA:  Palpable mass right breast.   EXAM: DIGITAL DIAGNOSTIC BILATERAL MAMMOGRAM WITH TOMOSYNTHESIS; ULTRASOUND LEFT BREAST LIMITED; ULTRASOUND RIGHT BREAST LIMITED   TECHNIQUE: Bilateral digital diagnostic mammography and breast  tomosynthesis was performed.; Targeted ultrasound examination of the left breast was performed.; Targeted ultrasound examination of the right breast was performed   COMPARISON:  None.   ACR Breast Density Category b: There are scattered areas of fibroglandular density.   FINDINGS: Cc and MLO views of bilateral breasts, spot tangential right breast are submitted. There is a mass at the palpable area right breast 12 o'clock. A second mass is noted in the upper-outer quadrant right breast. There is a third mass in the medial slight lower right breast.   Images of the left breast demonstrate architectural distortion in the medial probably 3 o'clock left breast. There is a mass in the subareolar lower inner quadrant left breast. There is a mass in the posterior lower inner quadrant left breast. There is a mass in the upper-outer quadrant left breast. At least 2 central mid depth subareolar masses are identified.   Targeted ultrasound is performed, showing a 3.1 x 1.8 x 2.9 cm oval macrolobulated hypoechoic mass at right breast 1 o'clock 8 cm nipple. At the right breast 2 o'clock 10 cm from nipple, there is a 2.4 x 0.9 x 1.6 cm oval hypoechoic mass. At right breast 10 o'clock 10 cm nipple, there is a 3.8 x 2.4 x 3.8 cm macro lobulated hypoechoic mass. These correlate to the mammographic masses. Ultrasound of the right axilla is negative.   Ultrasound of the left breast demonstrate  a 4.4 x 1.3 x 2.9 cm oval heterogeneous hypoechoic lesion at the left breast 9 o'clock 3 cm from nipple correlating to the mass in the subareolar lower inner quadrant left breast. There are no focal discrete lesion identified that would correlate to the architectural distortion seen mammogram in the medial left breast. At the left breast 3 o'clock 4 cm from nipple, there is slight lobulated mixed echotexture measuring 0.8 x 0.5 x 0.6 cm. At the left breast 1 o'clock 2 cm from nipple, there is a 0.9 x 0.5 x  0.8 cm oval hypoechoic mass. At the left breast 8 o'clock subareolar region, there is 1.1 x 0.6 x 1 cm oval hypoechoic mass. Ultrasound of the left axilla is negative.   IMPRESSION: Highly suspicious findings.   RECOMMENDATION: Recommend stereotactic core biopsy of the architectural distortion in the medial left breast. Also recommend ultrasound-guided core biopsy of the left breast 3 o'clock mass. If either one of these biopsies is positive for cancer, consider ultrasound-guided core biopsy of the 4 cm mass at left breast 9 o'clock and the largest mass in right breast at 1 o'clock.   I have discussed the findings and recommendations with the patient. If applicable, a reminder letter will be sent to the patient regarding the next appointment.   BI-RADS CATEGORY  5: Highly suggestive of malignancy.     Electronically Signed   By: Sherian Rein M.D.   On: 06/21/2022 16:42 Within last 24 hrs: No results found.  Assessment    Radial scar/complex sclerosing lesion, flat epithelial atypia, upper inner quadrant left breast. Benign fibroadenomas, 1 of significance in the medial upper right breast.  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  At patient's request lumpectomy of the upper medial right breast mass.  Risks of proceeding with breast biopsy discussed in detail with patient these include but not limited to anesthesia, bleeding, infection, need for additional procedures, need for follow-up imaging regardless of pathology.    She desires to proceed with  lumpectomy from the right breast as it is quite prominent.  Otherwise her questions have been adequately answered, no guarantees were ever expressed or implied.  Face-to-face time spent with the patient and accompanying care providers(if present) was 30 minutes, with more than 50% of the time spent counseling, educating, and coordinating care of the patient.    These notes generated with voice recognition software. I apologize for typographical errors.  Campbell Lerner M.D., FACS 04/22/2023, 9:10 AM

## 2023-04-15 NOTE — Patient Instructions (Signed)
We have spoken today about removing a lump in your breast. This will be done by Dr. Claudine Mouton at [facility].  You will most likely be able to leave the hospital several hours after your surgery. Rarely, a patient needs to stay over night but this is a possibility.  Plan to tentatively be off work for 1-2 weeks following the surgery and may return with approximately 2 more weeks of a lifting restriction, no greater than 15 lbs.  Please see your Blue surgery sheet for more information. Our surgery scheduler will call you to look at surgery dates and to go over information.   If you have FMLA or Disability paperwork that needs to be filled out, please have your company fax your paperwork to [phone] or you may drop this by either office. This paperwork will be filled out within 3 days after your surgery has been completed.  We will find out if we can still use the locator tag that is already in your breast. We will let you know about this.    Lumpectomy A lumpectomy is a form of "breast conserving" or "breast preservation" surgery. It may also be referred to as a partial mastectomy. During a lumpectomy, the portion of the breast that contains the cancerous tumor or breast mass (the lump) is removed. Some normal tissue around the lump may also be removed to make sure all of the tumor has been removed.  LET [facility] CARE PROVIDER KNOW ABOUT: Any allergies you have. All medicines you are taking, including vitamins, herbs, eye drops, creams, and over-the-counter medicines. Previous problems you or members of your family have had with the use of anesthetics. Any blood disorders you have. Previous surgeries you have had. Medical conditions you have. RISKS AND COMPLICATIONS Generally, this is a safe procedure. However, problems can occur and include: Bleeding. Infection. Pain. Temporary swelling. Change in the shape of the breast, particularly if a large portion is removed. BEFORE THE  PROCEDURE Ask your health care provider about changing or stopping your regular medicines. This is especially important if you are taking diabetes medicines or blood thinners. Do not eat or drink anything after midnight on the night before the procedure or as directed by your health care provider. Ask your health care provider if you can take a sip of water with any approved medicines. On the day of surgery, your health care provider will use a mammogram or ultrasound to locate and mark the tumor in your breast. These markings on your breast will show where the cut (incision) will be made.   PROCEDURE  An IV tube will be put into one of your veins. You may be given medicine to help you relax before the surgery (sedative). You will be given one of the following: A medicine that numbs the area (local anesthetic). A medicine that makes you fall asleep (general anesthetic). Your health care provider will use a kind of electric scalpel that uses heat to minimize bleeding (electrocautery knife). A curved incision (like a smile or frown) that follows the natural curve of your breast is made, to allow for minimal scarring and better healing. The tumor will be removed with some of the surrounding tissue. This will be sent to the lab for analysis. Your health care provider may also remove your lymph nodes at this time if needed. Sometimes, but not always, a rubber tube called a drain will be surgically inserted into your breast area or armpit to collect excess fluid that may accumulate  in the space where the tumor was. This drain is connected to a plastic bulb on the outside of your body. This drain creates suction to help remove the fluid. The incisions will be closed with stitches (sutures). A bandage may be placed over the incisions. AFTER THE PROCEDURE You will be taken to the recovery area. You will be given medicine for pain. A small rubber drain may be placed in the breast for 2-3 days to prevent a  collection of blood (hematoma) from developing in the breast. You will be given instructions on caring for the drain before you go home. A pressure bandage (dressing) will be applied for 1-2 days to prevent bleeding. Ask your health care provider how to care for your bandage at home.   This information is not intended to replace advice given to you by your health care provider. Make sure you discuss any questions you have with your health care provider.   Document Released: 08/12/2006 Document Revised: 07/22/2014 Document Reviewed: 12/04/2012 Elsevier Interactive Patient Education [employer]

## 2023-04-15 NOTE — H&P (View-Only) (Signed)
Patient ID: Melody Hall, female   DOB: 1985-11-06, 37 y.o.   MRN: [id number]  Chief Complaint:  FEA/radial scar, left breast.   History of Present Illness She follows up today, wanting to proceed with surgery, between insurance issues and social issues regarding her ride we have finally seem to have all the ironed out and can anticipate proceeding with the previously planned excisional biopsy, and the desired right breast lumpectomy.  She has deferred following through with the intended RFID tagged excisional biopsy for the left breast lesion.  She now presents ready to proceed with surgery.   Melody Hall is a 37 y.o. 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  She feels it has grown somewhat larger though that is not why she is here today.  She has had stereotactic biopsy of an area of distortion, and ultrasound-guided biopsies of the most concerning of masses. She has taken birth control.  She has no family history of breast cancer.  She began menstruating at the age of 60.  She is gravida 2 para 2.  She is 19 with her first pregnancy.  She did not breast-feed.  She has known palpable breast lumps, most notably upper medial right breast.  No history of nipple discharge or dermal changes.  She denies any history of breast pain.  Past Medical History Past Medical History:  Diagnosis Date   Anemia    2016   Breast lesion 07/31/2018   Chronic idiopathic urticaria    Depression    Elevated blood pressure reading    Fibroadenosis of breast    Hives    Hypertension    Mental disorder    Depression      Past Surgical History:  Procedure Laterality Date   BREAST BIOPSY Left 08/14/2022   Stereo Bx coil clip - path pending   BREAST BIOPSY Left 08/14/2022   [place] LT BREAST BX W LOC DEV 1ST LESION IMG BX SPEC US GUIDE 08/14/2022 [facility]   BREAST BIOPSY Left 08/14/2022   MM LT BREAST BX W LOC  DEV 1ST LESION IMAGE BX SPEC STEREO GUIDE 08/14/2022 [facility]   BREAST BIOPSY Right 08/28/2022   u/s bx, 1:00 RIBBON clip-path pending   BREAST BIOPSY Left 08/28/2022   u/s bx, 9:00  RIBBON clip-path pending   BREAST BIOPSY Left 08/28/2022   [place] LT BREAST BX W LOC DEV 1ST LESION IMG BX SPEC US GUIDE 08/28/2022 [facility]   BREAST BIOPSY Right 08/28/2022   [place] RT BREAST BX W LOC DEV 1ST LESION IMG BX SPEC US GUIDE 08/28/2022 [facility]   BREAST LUMPECTOMY WITH RADIOFREQUENCY TAG IDENTIFICATION Left 10/30/2022   RF tag for coil clip CSL    No Known Allergies   Current Outpatient Medications  Medication Sig Dispense Refill   diphenhydrAMINE (BENADRYL) 25 mg capsule Take 25 mg by mouth every 6 (six) hours as needed.     ibuprofen (ADVIL) 400 MG tablet Take 400 mg by mouth every 6 (six) hours as needed.     Current Facility-Administered Medications  Medication Dose Route Frequency Provider Last Rate Last Admin   medroxyPROGESTERone (DEPO-PROVERA) injection 150 mg  150 mg Intramuscular Q90 days Lenice Llamas, FNP   150 mg at 11/13/22 1707    Family History Family History  Problem Relation Age of Onset   Healthy Paternal Grandfather    Healthy Paternal Grandmother    Healthy Maternal Grandmother  Healthy Maternal Grandfather    Healthy Father    Healthy Mother       Social History Social History   Tobacco Use   Smoking status: Never    Passive exposure: Never   Smokeless tobacco: Never  Vaping Use   Vaping status: Never Used  Substance Use Topics   Alcohol use: Yes    Alcohol/week: 2.0 standard drinks of alcohol    Types: 2 Glasses of wine per week    Comment: occ   Drug use: Not Currently    Types: Marijuana        Review of Systems  Constitutional: Negative.   HENT: Negative.    Eyes: Negative.   Respiratory: Negative.    Cardiovascular: Negative.   Gastrointestinal: Negative.   Genitourinary: Negative.   Skin: Negative.    Neurological: Negative.   Psychiatric/Behavioral: Negative.       Physical Exam Blood pressure 124/87, pulse 93, temperature 98.6 F (37 C), temperature source Oral, height 5\' 4"  (1.626 m), weight 199 lb 9.6 oz (90.5 kg), SpO2 97%. Last Weight  Most recent update: 04/15/2023 10:56 AM    Weight  90.5 kg (199 lb 9.6 oz)               CONSTITUTIONAL: Well developed, and nourished, appropriately responsive and aware without distress.   EYES: Sclera non-icteric.   EARS, NOSE, MOUTH AND THROAT:  The oropharynx is clear. Oral mucosa is pink and moist.     Hearing is intact to voice.  NECK: Trachea is midline, and there is no jugular venous distension.  LYMPH NODES:  Lymph nodes in the neck are not appreciated. RESPIRATORY:  Lungs are clear, and breath sounds are equal bilaterally.   Normal respiratory effort without pathologic use of accessory muscles. CARDIOVASCULAR: Heart is regular in rate and rhythm.   Well perfused.  GI: The abdomen is  soft, nontender, and nondistended.  GU: [name] is present as chaperone.  There is a very large prominent right upper medial breast mass that is visible from a distance.  There are other smaller masses in both breasts consistent with benign fibroadenomas. MUSCULOSKELETAL:  Symmetrical muscle tone appreciated in all four extremities.    SKIN: Skin turgor is normal. No pathologic skin lesions appreciated.  NEUROLOGIC:  Motor and sensation appear grossly normal.  Cranial nerves are grossly without defect. PSYCH:  Alert and oriented to person, place and time. Affect is appropriate for situation.  Data Reviewed I have personally reviewed what is currently available of the patient's imaging, recent labs and medical records.   Labs:     Latest Ref Rng & Units 02/18/2023    9:39 AM 12/06/2022   12:38 PM 11/13/2022    2:05 PM  CBC  WBC 4.0 - 10.5 K/uL 9.7  6.0  6.6   Hemoglobin 12.0 - 15.0 g/dL 18.8  41.6  60.6   Hematocrit 36.0 - 46.0 % 37.4  40.1   35.6   Platelets 150 - 400 K/uL 302  328  284       Latest Ref Rng & Units 02/18/2023    9:39 AM 12/06/2022   12:38 PM  CMP  Glucose 70 - 99 mg/dL 93  94   BUN 6 - 20 mg/dL 17  11   Creatinine 3.01 - 1.00 mg/dL 6.01  0.93   Sodium 235 - 145 mmol/L 137  138   Potassium 3.5 - 5.1 mmol/L 3.5  3.8   Chloride 98 - 111 mmol/L  106  107   CO2 22 - 32 mmol/L 23  23   Calcium 8.9 - 10.3 mg/dL 9.4  9.6   Total Protein 6.5 - 8.1 g/dL 7.4    Total Bilirubin 0.3 - 1.2 mg/dL 0.4    Alkaline Phos 38 - 126 U/L 81    AST 15 - 41 U/L 25    ALT 0 - 44 U/L 57     SURGICAL PATHOLOGY  CASE: [id number]  PATIENT: Jaeliana Fedora  Surgical Pathology Report   Specimen Submitted:  A. Breast, right  B. Breast, left   Clinical History: 37 YO F with 1) 3.1 cm upper inner right breast mass -  1:00 position, 2) 4.4 cm inner left breast mass - 9:00 position. A -  Ribbon-shaped clip placed following ultrasound guided biopsy of RIGHT  breast at 1 o'clock. B - Ribbon-shaped clip placed following ultrasound  guided biopsy of LEFT breast at 9 o'clock.   DIAGNOSIS:  A. BREAST, RIGHT AT 1:00, 8 CM FROM THE NIPPLE; ULTRASOUND-GUIDED CORE  NEEDLE BIOPSY:  - FRAGMENTS OF BENIGN FIBROADENOMA.  - NEGATIVE FOR ATYPICAL PROLIFERATIVE BREAST DISEASE.   B. BREAST, LEFT AT 9:00, 3 CM FROM THE NIPPLE; ULTRASOUND-GUIDED CORE  NEEDLE BIOPSY:  - FRAGMENTS OF BENIGN FIBROADENOMA, WITH FOCAL USUAL DUCTAL HYPERPLASIA.  - NEGATIVE FOR ATYPICAL PROLIFERATIVE BREAST DISEASE.    SURGICAL PATHOLOGY  CASE: [id number]  PATIENT: Alisan Guimont  Surgical Pathology Report   Specimen Submitted:  A. Breast, left, 3:00  B. Breast, left UIQ   Clinical History: Mass.  R/O fibroadenoma vs IMC.  Distortion.  R/O IMC.  A - Heart-shaped clip placed following ultrasound guided biopsy of LEFT  breast at 3 o'clock. B - Coil-shaped clip placed following stereotactic  biopsy of LEFT breast, upper inner quadrant.   DIAGNOSIS:  A.  BREAST, LEFT AT 3:00, 4 CM FROM THE NIPPLE; ULTRASOUND-GUIDED CORE  NEEDLE BIOPSY:  - FRAGMENTS OF BENIGN FIBROADENOMA, WITH ASSOCIATED DYSTROPHIC  CALCIFICATIONS.  - NEGATIVE FOR ATYPICAL PROLIFERATIVE BREAST DISEASE.   B. BREAST, LEFT UPPER INNER QUADRANT; STEREOTACTIC CORE NEEDLE BIOPSY:  - FLAT EPITHELIAL ATYPIA.  - BACKGROUND CHANGES SUGGESTIVE OF COMPLEX SCLEROSING LESION/RADIAL  SCAR, WITH ASSOCIATED COLUMNAR CELL CHANGE.  - NEGATIVE FOR CARCINOMA IN SITU AND MALIGNANCY.   Imaging: Radiological images reviewed:   CLINICAL DATA:  Palpable mass right breast.   EXAM: DIGITAL DIAGNOSTIC BILATERAL MAMMOGRAM WITH TOMOSYNTHESIS; ULTRASOUND LEFT BREAST LIMITED; ULTRASOUND RIGHT BREAST LIMITED   TECHNIQUE: Bilateral digital diagnostic mammography and breast tomosynthesis was performed.; Targeted ultrasound examination of the left breast was performed.; Targeted ultrasound examination of the right breast was performed   COMPARISON:  None.   ACR Breast Density Category b: There are scattered areas of fibroglandular density.   FINDINGS: Cc and MLO views of bilateral breasts, spot tangential right breast are submitted. There is a mass at the palpable area right breast 12 o'clock. A second mass is noted in the upper-outer quadrant right breast. There is a third mass in the medial slight lower right breast.   Images of the left breast demonstrate architectural distortion in the medial probably 3 o'clock left breast. There is a mass in the subareolar lower inner quadrant left breast. There is a mass in the posterior lower inner quadrant left breast. There is a mass in the upper-outer quadrant left breast. At least 2 central mid depth subareolar masses are identified.   Targeted ultrasound is performed, showing a 3.1 x 1.8 x 2.9 cm oval macrolobulated hypoechoic  mass at right breast 1 o'clock 8 cm nipple. At the right breast 2 o'clock 10 cm from nipple, there is a 2.4 x 0.9 x  1.6 cm oval hypoechoic mass. At right breast 10 o'clock 10 cm nipple, there is a 3.8 x 2.4 x 3.8 cm macro lobulated hypoechoic mass. These correlate to the mammographic masses. Ultrasound of the right axilla is negative.   Ultrasound of the left breast demonstrate a 4.4 x 1.3 x 2.9 cm oval heterogeneous hypoechoic lesion at the left breast 9 o'clock 3 cm from nipple correlating to the mass in the subareolar lower inner quadrant left breast. There are no focal discrete lesion identified that would correlate to the architectural distortion seen mammogram in the medial left breast. At the left breast 3 o'clock 4 cm from nipple, there is slight lobulated mixed echotexture measuring 0.8 x 0.5 x 0.6 cm. At the left breast 1 o'clock 2 cm from nipple, there is a 0.9 x 0.5 x 0.8 cm oval hypoechoic mass. At the left breast 8 o'clock subareolar region, there is 1.1 x 0.6 x 1 cm oval hypoechoic mass. Ultrasound of the left axilla is negative.   IMPRESSION: Highly suspicious findings.   RECOMMENDATION: Recommend stereotactic core biopsy of the architectural distortion in the medial left breast. Also recommend ultrasound-guided core biopsy of the left breast 3 o'clock mass. If either one of these biopsies is positive for cancer, consider ultrasound-guided core biopsy of the 4 cm mass at left breast 9 o'clock and the largest mass in right breast at 1 o'clock.   I have discussed the findings and recommendations with the patient. If applicable, a reminder letter will be sent to the patient regarding the next appointment.   BI-RADS CATEGORY  5: Highly suggestive of malignancy.     Electronically Signed   By: Sherian Rein M.D.   On: 06/21/2022 16:42  CLINICAL DATA:  Palpable mass right breast.   EXAM: DIGITAL DIAGNOSTIC BILATERAL MAMMOGRAM WITH TOMOSYNTHESIS; ULTRASOUND LEFT BREAST LIMITED; ULTRASOUND RIGHT BREAST LIMITED   TECHNIQUE: Bilateral digital diagnostic mammography and breast  tomosynthesis was performed.; Targeted ultrasound examination of the left breast was performed.; Targeted ultrasound examination of the right breast was performed   COMPARISON:  None.   ACR Breast Density Category b: There are scattered areas of fibroglandular density.   FINDINGS: Cc and MLO views of bilateral breasts, spot tangential right breast are submitted. There is a mass at the palpable area right breast 12 o'clock. A second mass is noted in the upper-outer quadrant right breast. There is a third mass in the medial slight lower right breast.   Images of the left breast demonstrate architectural distortion in the medial probably 3 o'clock left breast. There is a mass in the subareolar lower inner quadrant left breast. There is a mass in the posterior lower inner quadrant left breast. There is a mass in the upper-outer quadrant left breast. At least 2 central mid depth subareolar masses are identified.   Targeted ultrasound is performed, showing a 3.1 x 1.8 x 2.9 cm oval macrolobulated hypoechoic mass at right breast 1 o'clock 8 cm nipple. At the right breast 2 o'clock 10 cm from nipple, there is a 2.4 x 0.9 x 1.6 cm oval hypoechoic mass. At right breast 10 o'clock 10 cm nipple, there is a 3.8 x 2.4 x 3.8 cm macro lobulated hypoechoic mass. These correlate to the mammographic masses. Ultrasound of the right axilla is negative.   Ultrasound of the left breast demonstrate  a 4.4 x 1.3 x 2.9 cm oval heterogeneous hypoechoic lesion at the left breast 9 o'clock 3 cm from nipple correlating to the mass in the subareolar lower inner quadrant left breast. There are no focal discrete lesion identified that would correlate to the architectural distortion seen mammogram in the medial left breast. At the left breast 3 o'clock 4 cm from nipple, there is slight lobulated mixed echotexture measuring 0.8 x 0.5 x 0.6 cm. At the left breast 1 o'clock 2 cm from nipple, there is a 0.9 x 0.5 x  0.8 cm oval hypoechoic mass. At the left breast 8 o'clock subareolar region, there is 1.1 x 0.6 x 1 cm oval hypoechoic mass. Ultrasound of the left axilla is negative.   IMPRESSION: Highly suspicious findings.   RECOMMENDATION: Recommend stereotactic core biopsy of the architectural distortion in the medial left breast. Also recommend ultrasound-guided core biopsy of the left breast 3 o'clock mass. If either one of these biopsies is positive for cancer, consider ultrasound-guided core biopsy of the 4 cm mass at left breast 9 o'clock and the largest mass in right breast at 1 o'clock.   I have discussed the findings and recommendations with the patient. If applicable, a reminder letter will be sent to the patient regarding the next appointment.   BI-RADS CATEGORY  5: Highly suggestive of malignancy.     Electronically Signed   By: Sherian Rein M.D.   On: 06/21/2022 16:42 Within last 24 hrs: No results found.  Assessment    Radial scar/complex sclerosing lesion, flat epithelial atypia, upper inner quadrant left breast. Benign fibroadenomas, 1 of significance in the medial upper right breast.  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  At patient's request lumpectomy of the upper medial right breast mass.  Risks of proceeding with breast biopsy discussed in detail with patient these include but not limited to anesthesia, bleeding, infection, need for additional procedures, need for follow-up imaging regardless of pathology.    She desires to proceed with  lumpectomy from the right breast as it is quite prominent.  Otherwise her questions have been adequately answered, no guarantees were ever expressed or implied.  Face-to-face time spent with the patient and accompanying care providers(if present) was 30 minutes, with more than 50% of the time spent counseling, educating, and coordinating care of the patient.    These notes generated with voice recognition software. I apologize for typographical errors.  Campbell Lerner M.D., FACS 04/22/2023, 9:10 AM

## 2023-04-15 NOTE — Telephone Encounter (Signed)
Unable to leave message, no voice mail available. If patient calls back, please inform her of the following regarding scheduled surgery with Dr. Claudine Mouton.   Pre-Admission date/time, and Surgery date at [facility].  Surgery Date: 04/21/23 Preadmission Testing Date: 04/18/23 (phone 8a-1p)  Also patient will need to call at [phone], between 1-3:00pm the day before surgery, to find out what time to arrive for surgery.

## 2023-04-16 ENCOUNTER — Telehealth: Payer: Self-pay | Admitting: *Deleted

## 2023-04-16 NOTE — Telephone Encounter (Signed)
Patient would like to get a work note faxed to her at [phone] staying that she was at the office yesterday

## 2023-04-17 NOTE — Telephone Encounter (Signed)
Updated information regarding rescheduled surgery again at patient's request.  Unable to leave message, voice mailbox is full.  If patient calls back, please inform her of scheduled surgery with Dr. Claudine Mouton.   Pre-Admission date/time, and Surgery date at [facility].  Surgery Date: 04/28/23 Preadmission Testing Date: 04/22/23 (phone 8a-1p)  Also patient will need to call at [phone], between 1-3:00pm the day before surgery, to find out what time to arrive for surgery.

## 2023-04-18 ENCOUNTER — Other Ambulatory Visit: Payer: BLUE CROSS/BLUE SHIELD

## 2023-04-18 NOTE — Telephone Encounter (Signed)
Called patient again, she answers and says let me call you back in an hour, she is still sleeping.

## 2023-04-18 NOTE — Telephone Encounter (Signed)
Patient calls back, she is now informed of all dates regarding her surgery.   

## 2023-04-22 ENCOUNTER — Other Ambulatory Visit: Payer: Self-pay

## 2023-04-22 ENCOUNTER — Ambulatory Visit: Payer: Self-pay | Admitting: Surgery

## 2023-04-22 ENCOUNTER — Encounter
Admission: RE | Admit: 2023-04-22 | Discharge: 2023-04-22 | Disposition: A | Discharge status: Home / Self Care | Payer: Medicaid Other | Source: Ambulatory Visit | Attending: Surgery | Admitting: Surgery

## 2023-04-22 HISTORY — DX: Idiopathic urticaria: L50.1

## 2023-04-22 NOTE — Patient Instructions (Addendum)
Your procedure is scheduled on: Monday, October 14 Report to the Registration Desk on the 1st floor of the [facility] To find out your arrival time, please call [phone] between 1PM - 3PM on: Friday, October 11 If your arrival time is 6:00 am, do not arrive before that time as the [facility] entrance doors do not open until 6:00 am.  REMEMBER: Instructions that are not followed completely may result in serious medical risk, up to and including death; or upon the discretion of your surgeon and anesthesiologist your surgery may need to be rescheduled.  Do not eat or drink after midnight the night before surgery.  No gum chewing or hard candies.  One week prior to surgery: starting today, October 8 Stop Anti-inflammatories (NSAIDS) such as Advil, Aleve, Ibuprofen, Motrin, Naproxen, Naprosyn and Aspirin based products such as Excedrin, Goody's Powder, BC Powder. Stop ANY OVER THE COUNTER supplements until after surgery. You may however, continue to take Tylenol if needed for pain up until the day of surgery.  DO NOT TAKE ANY MEDICATIONS THE MORNING OF SURGERY   No Alcohol for 24 hours before or after surgery.  No Smoking including e-cigarettes for 24 hours before surgery.  No chewable tobacco products for at least 6 hours before surgery.  No nicotine patches on the day of surgery.  Do not use any "recreational" drugs for at least a week (preferably 2 weeks) before your surgery.  Please be advised that the combination of cocaine and anesthesia may have negative outcomes, up to and including death. If you test positive for cocaine, your surgery will be cancelled.  On the morning of surgery brush your teeth with toothpaste and water, you may rinse your mouth with mouthwash if you wish. Do not swallow any toothpaste or mouthwash.  Use CHG Soap as directed on instruction sheet.  Do not wear jewelry, make-up, hairpins, clips or nail polish.  For welded (permanent) jewelry:  bracelets, anklets, waist bands, etc.  Please have this removed prior to surgery.  If it is not removed, there is a chance that hospital personnel will need to cut it off on the day of surgery.  Do not wear lotions, powders, or perfumes.   Do not shave body hair from the neck down 48 hours before surgery.  Contact lenses, hearing aids and dentures may not be worn into surgery.  Do not bring valuables to the hospital. [facility] is not responsible for any missing/lost belongings or valuables.   Notify your doctor if there is any change in your medical condition (cold, fever, infection).  Wear comfortable clothing (specific to your surgery type) to the hospital.  After surgery, you can help prevent lung complications by doing breathing exercises.  Take deep breaths and cough every 1-2 hours.   If you are being discharged the day of surgery, you will not be allowed to drive home. You will need a responsible individual to drive you home and stay with you for 24 hours after surgery.   If you are taking public transportation, you will need to have a responsible individual with you.  Please call the Pre-admissions Testing Dept. at [phone] if you have any questions about these instructions.  Surgery Visitation Policy:  Patients having surgery or a procedure may have two visitors.  Children under the age of 30 must have an adult with them who is not the patient.     Preparing for Surgery with CHLORHEXIDINE GLUCONATE (CHG) Soap  Chlorhexidine Gluconate (CHG) Soap  o An antiseptic cleaner that kills germs and bonds with the skin to continue killing germs even after washing  o Used for showering the night before surgery and morning of surgery  Before surgery, you can play an important role by reducing the number of germs on your skin.  CHG (Chlorhexidine gluconate) soap is an antiseptic cleanser which kills germs and bonds with the skin to continue killing germs even after  washing.  Please do not use if you have an allergy to CHG or antibacterial soaps. If your skin becomes reddened/irritated stop using the CHG.  1. Shower the NIGHT BEFORE SURGERY and the MORNING OF SURGERY with CHG soap.  2. If you choose to wash your hair, wash your hair first as usual with your normal shampoo.  3. After shampooing, rinse your hair and body thoroughly to remove the shampoo.  4. Use CHG as you would any other liquid soap. You can apply CHG directly to the skin and wash gently with a scrungie or a clean washcloth.  5. Apply the CHG soap to your body only from the neck down. Do not use on open wounds or open sores. Avoid contact with your eyes, ears, mouth, and genitals (private parts). Wash face and genitals (private parts) with your normal soap.  6. Wash thoroughly, paying special attention to the area where your surgery will be performed.  7. Thoroughly rinse your body with warm water.  8. Do not shower/wash with your normal soap after using and rinsing off the CHG soap.  9. Pat yourself dry with a clean towel.  10. Wear clean pajamas to bed the night before surgery.  12. Place clean sheets on your bed the night of your first shower and do not sleep with pets.  13. Shower again with the CHG soap on the day of surgery prior to arriving at the hospital.  14. Do not apply any deodorants/lotions/powders.  15. Please wear clean clothes to the hospital.

## 2023-04-27 MED ORDER — CEFAZOLIN SODIUM-DEXTROSE 2-4 GM/100ML-% IV SOLN
2.0000 g | INTRAVENOUS | Status: AC
  Administered 2023-04-28: 2 g via INTRAVENOUS

## 2023-04-27 MED ORDER — CHLORHEXIDINE GLUCONATE 0.12 % MT SOLN
15.0000 mL | Freq: Once | OROMUCOSAL | Status: AC
  Administered 2023-04-28: 15 mL via OROMUCOSAL

## 2023-04-27 MED ORDER — CHLORHEXIDINE GLUCONATE CLOTH 2 % EX PADS: 6.0000 | MEDICATED_PAD | Freq: Once | CUTANEOUS | Status: DC

## 2023-04-27 MED ORDER — FAMOTIDINE 20 MG PO TABS
20.0000 mg | ORAL_TABLET | Freq: Once | ORAL | Status: AC
  Administered 2023-04-28: 20 mg via ORAL

## 2023-04-27 MED ORDER — CELECOXIB 200 MG PO CAPS
200.0000 mg | ORAL_CAPSULE | ORAL | Status: AC
  Administered 2023-04-28: 200 mg via ORAL

## 2023-04-27 MED ORDER — GABAPENTIN 300 MG PO CAPS
300.0000 mg | ORAL_CAPSULE | ORAL | Status: AC
  Administered 2023-04-28: 300 mg via ORAL

## 2023-04-27 MED ORDER — ACETAMINOPHEN 500 MG PO TABS
1000.0000 mg | ORAL_TABLET | ORAL | Status: AC
  Administered 2023-04-28: 1000 mg via ORAL

## 2023-04-27 MED ORDER — LACTATED RINGERS IV SOLN: INTRAVENOUS | Status: DC

## 2023-04-27 MED ORDER — BUPIVACAINE LIPOSOME 1.3 % IJ SUSP: 20.0000 mL | Freq: Once | INTRAMUSCULAR | Status: DC

## 2023-04-27 MED ORDER — ORAL CARE MOUTH RINSE: 15.0000 mL | Freq: Once | OROMUCOSAL | Status: AC

## 2023-04-28 ENCOUNTER — Ambulatory Visit: Payer: Medicaid Other

## 2023-04-28 ENCOUNTER — Ambulatory Visit
Admission: RE | Admit: 2023-04-28 | Discharge: 2023-04-28 | Disposition: A | Discharge status: Home / Self Care | Payer: Medicaid Other | Attending: Surgery | Admitting: Surgery

## 2023-04-28 ENCOUNTER — Encounter
Admission: RE | Disposition: A | Discharge status: Home / Self Care | Payer: Self-pay | Source: Home / Self Care | Attending: Surgery

## 2023-04-28 ENCOUNTER — Ambulatory Visit: Payer: Medicaid Other | Admitting: Anesthesiology

## 2023-04-28 ENCOUNTER — Ambulatory Visit
Admission: RE | Admit: 2023-04-28 | Discharge: 2023-04-28 | Disposition: A | Discharge status: Home / Self Care | Payer: Medicaid Other | Source: Home / Self Care | Attending: Surgery | Admitting: Surgery

## 2023-04-28 ENCOUNTER — Other Ambulatory Visit: Payer: Self-pay

## 2023-04-28 ENCOUNTER — Ambulatory Visit: Payer: Medicaid Other | Admitting: Urgent Care

## 2023-04-28 ENCOUNTER — Encounter: Payer: Self-pay | Admitting: Surgery

## 2023-04-28 DIAGNOSIS — N6022 Fibroadenosis of left breast: Secondary | ICD-10-CM | POA: Insufficient documentation

## 2023-04-28 DIAGNOSIS — D241 Benign neoplasm of right breast: Secondary | ICD-10-CM | POA: Diagnosis present

## 2023-04-28 DIAGNOSIS — N6082 Other benign mammary dysplasias of left breast: Secondary | ICD-10-CM | POA: Diagnosis not present

## 2023-04-28 DIAGNOSIS — I10 Essential (primary) hypertension: Secondary | ICD-10-CM | POA: Diagnosis not present

## 2023-04-28 DIAGNOSIS — Z793 Long term (current) use of hormonal contraceptives: Secondary | ICD-10-CM | POA: Diagnosis not present

## 2023-04-28 DIAGNOSIS — L905 Scar conditions and fibrosis of skin: Secondary | ICD-10-CM | POA: Diagnosis present

## 2023-04-28 DIAGNOSIS — Z01812 Encounter for preprocedural laboratory examination: Secondary | ICD-10-CM

## 2023-04-28 DIAGNOSIS — D242 Benign neoplasm of left breast: Secondary | ICD-10-CM | POA: Diagnosis not present

## 2023-04-28 DIAGNOSIS — N6489 Other specified disorders of breast: Secondary | ICD-10-CM | POA: Insufficient documentation

## 2023-04-28 HISTORY — PX: EXCISION OF BREAST BIOPSY: SHX5822

## 2023-04-28 HISTORY — PX: BREAST LUMPECTOMY: SHX2

## 2023-04-28 LAB — POCT PREGNANCY, URINE: Preg Test, Ur: NEGATIVE

## 2023-04-28 SURGERY — EXCISION OF BREAST BIOPSY
Anesthesia: General | Site: Breast | Laterality: Right

## 2023-04-28 MED ORDER — OXYCODONE HCL 5 MG PO TABS
5.0000 mg | ORAL_TABLET | Freq: Once | ORAL | Status: AC | PRN
  Administered 2023-04-28: 5 mg via ORAL

## 2023-04-28 MED ORDER — FENTANYL CITRATE (PF) 100 MCG/2ML IJ SOLN
INTRAMUSCULAR | Status: AC
  Filled 2023-04-28: qty 2

## 2023-04-28 MED ORDER — GABAPENTIN 300 MG PO CAPS
ORAL_CAPSULE | ORAL | Status: AC
  Filled 2023-04-28: qty 1

## 2023-04-28 MED ORDER — DEXMEDETOMIDINE HCL IN NACL 80 MCG/20ML IV SOLN
INTRAVENOUS | Status: DC | PRN
Start: 2023-04-28 — End: 2023-04-28
  Administered 2023-04-28: 12 ug via INTRAVENOUS

## 2023-04-28 MED ORDER — ONDANSETRON HCL 4 MG/2ML IJ SOLN
INTRAMUSCULAR | Status: DC | PRN
  Administered 2023-04-28: 4 mg via INTRAVENOUS

## 2023-04-28 MED ORDER — CELECOXIB 200 MG PO CAPS
ORAL_CAPSULE | ORAL | Status: AC
  Filled 2023-04-28: qty 1

## 2023-04-28 MED ORDER — PROPOFOL 10 MG/ML IV BOLUS
INTRAVENOUS | Status: DC | PRN
  Administered 2023-04-28: 100 ug/kg/min via INTRAVENOUS
  Administered 2023-04-28: 200 mg via INTRAVENOUS

## 2023-04-28 MED ORDER — HYDROCODONE-ACETAMINOPHEN 5-325 MG PO TABS: 1.0000 | ORAL_TABLET | Freq: Four times a day (QID) | ORAL | 0 refills | Status: DC | PRN

## 2023-04-28 MED ORDER — MIDAZOLAM HCL 2 MG/2ML IJ SOLN
INTRAMUSCULAR | Status: AC
  Filled 2023-04-28: qty 2

## 2023-04-28 MED ORDER — BUPIVACAINE-EPINEPHRINE (PF) 0.25% -1:200000 IJ SOLN
INTRAMUSCULAR | Status: AC
  Filled 2023-04-28: qty 30

## 2023-04-28 MED ORDER — ACETAMINOPHEN 500 MG PO TABS
ORAL_TABLET | ORAL | Status: AC
  Filled 2023-04-28: qty 2

## 2023-04-28 MED ORDER — LIDOCAINE HCL (PF) 2 % IJ SOLN
INTRAMUSCULAR | Status: AC
  Filled 2023-04-28: qty 5

## 2023-04-28 MED ORDER — OXYCODONE HCL 5 MG/5ML PO SOLN: 5.0000 mg | Freq: Once | ORAL | Status: AC | PRN

## 2023-04-28 MED ORDER — ROCURONIUM BROMIDE 10 MG/ML (PF) SYRINGE
PREFILLED_SYRINGE | INTRAVENOUS | Status: AC
  Filled 2023-04-28: qty 10

## 2023-04-28 MED ORDER — CEFAZOLIN SODIUM-DEXTROSE 2-4 GM/100ML-% IV SOLN
INTRAVENOUS | Status: AC
  Filled 2023-04-28: qty 100

## 2023-04-28 MED ORDER — MIDAZOLAM HCL 2 MG/2ML IJ SOLN
INTRAMUSCULAR | Status: DC | PRN
  Administered 2023-04-28: 2 mg via INTRAVENOUS

## 2023-04-28 MED ORDER — STERILE WATER FOR IRRIGATION IR SOLN
Status: DC | PRN
  Administered 2023-04-28: 1000 mL

## 2023-04-28 MED ORDER — BUPIVACAINE LIPOSOME 1.3 % IJ SUSP
INTRAMUSCULAR | Status: AC
  Filled 2023-04-28: qty 20

## 2023-04-28 MED ORDER — ONDANSETRON HCL 4 MG/2ML IJ SOLN
INTRAMUSCULAR | Status: AC
  Filled 2023-04-28: qty 2

## 2023-04-28 MED ORDER — DEXAMETHASONE SODIUM PHOSPHATE 10 MG/ML IJ SOLN
INTRAMUSCULAR | Status: DC | PRN
  Administered 2023-04-28: 10 mg via INTRAVENOUS

## 2023-04-28 MED ORDER — DEXAMETHASONE SODIUM PHOSPHATE 10 MG/ML IJ SOLN
INTRAMUSCULAR | Status: AC
  Filled 2023-04-28: qty 1

## 2023-04-28 MED ORDER — PROPOFOL 1000 MG/100ML IV EMUL
INTRAVENOUS | Status: AC
  Filled 2023-04-28: qty 100

## 2023-04-28 MED ORDER — BUPIVACAINE-EPINEPHRINE 0.25% -1:200000 IJ SOLN
INTRAMUSCULAR | Status: DC | PRN
  Administered 2023-04-28: 30 mL

## 2023-04-28 MED ORDER — FENTANYL CITRATE (PF) 100 MCG/2ML IJ SOLN
25.0000 ug | INTRAMUSCULAR | Status: DC | PRN
  Administered 2023-04-28 (×3): 25 ug via INTRAVENOUS

## 2023-04-28 MED ORDER — FAMOTIDINE 20 MG PO TABS
ORAL_TABLET | ORAL | Status: AC
  Filled 2023-04-28: qty 1

## 2023-04-28 MED ORDER — CHLORHEXIDINE GLUCONATE 0.12 % MT SOLN
OROMUCOSAL | Status: AC
  Filled 2023-04-28: qty 15

## 2023-04-28 MED ORDER — LIDOCAINE HCL (CARDIAC) PF 100 MG/5ML IV SOSY
PREFILLED_SYRINGE | INTRAVENOUS | Status: DC | PRN
  Administered 2023-04-28: 100 mg via INTRAVENOUS

## 2023-04-28 MED ORDER — FENTANYL CITRATE (PF) 100 MCG/2ML IJ SOLN
INTRAMUSCULAR | Status: DC | PRN
  Administered 2023-04-28 (×2): 50 ug via INTRAVENOUS
  Administered 2023-04-28: 25 ug via INTRAVENOUS
  Administered 2023-04-28: 50 ug via INTRAVENOUS
  Administered 2023-04-28: 25 ug via INTRAVENOUS

## 2023-04-28 MED ORDER — IBUPROFEN 800 MG PO TABS: 800.0000 mg | ORAL_TABLET | Freq: Three times a day (TID) | ORAL | 0 refills | Status: DC | PRN

## 2023-04-28 MED ORDER — LACTATED RINGERS IV SOLN: INTRAVENOUS | Status: DC | PRN

## 2023-04-28 MED ORDER — PROPOFOL 10 MG/ML IV BOLUS
INTRAVENOUS | Status: AC
  Filled 2023-04-28: qty 20

## 2023-04-28 MED ORDER — OXYCODONE HCL 5 MG PO TABS
ORAL_TABLET | ORAL | Status: AC
  Filled 2023-04-28: qty 1

## 2023-04-28 SURGICAL SUPPLY — 36 items
ADH SKN CLS APL DERMABOND .7 (GAUZE/BANDAGES/DRESSINGS) ×2
APL PRP STRL LF DISP 70% ISPRP (MISCELLANEOUS) ×4
BLADE SURG 15 STRL LF DISP TIS (BLADE) ×2 IMPLANT
BLADE SURG 15 STRL SS (BLADE) ×2
CHLORAPREP W/TINT 26 (MISCELLANEOUS) ×2 IMPLANT
DERMABOND ADVANCED .7 DNX12 (GAUZE/BANDAGES/DRESSINGS) ×2 IMPLANT
DEVICE DUBIN SPECIMEN MAMMOGRA (MISCELLANEOUS) IMPLANT
DRAPE LAPAROTOMY TRNSV 106X77 (MISCELLANEOUS) ×2 IMPLANT
ELECT CAUTERY BLADE TIP 2.5 (TIP) ×4
ELECT REM PT RETURN 9FT ADLT (ELECTROSURGICAL) ×2
ELECTRODE CAUTERY BLDE TIP 2.5 (TIP) ×2 IMPLANT
ELECTRODE REM PT RTRN 9FT ADLT (ELECTROSURGICAL) ×2 IMPLANT
GAUZE 4X4 16PLY ~~LOC~~+RFID DBL (SPONGE) ×2 IMPLANT
GLOVE ORTHO TXT STRL SZ7.5 (GLOVE) ×2 IMPLANT
GOWN STRL REUS W/ TWL LRG LVL3 (GOWN DISPOSABLE) ×2 IMPLANT
GOWN STRL REUS W/ TWL XL LVL3 (GOWN DISPOSABLE) ×2 IMPLANT
GOWN STRL REUS W/TWL LRG LVL3 (GOWN DISPOSABLE) ×2
GOWN STRL REUS W/TWL XL LVL3 (GOWN DISPOSABLE) ×2
KIT TURNOVER KIT A (KITS) ×2 IMPLANT
MANIFOLD NEPTUNE II (INSTRUMENTS) ×2 IMPLANT
NDL HYPO 22X1.5 SAFETY MO (MISCELLANEOUS) ×2 IMPLANT
NEEDLE HYPO 22X1.5 SAFETY MO (MISCELLANEOUS) ×2 IMPLANT
PACK BASIN MINOR ARMC (MISCELLANEOUS) ×2 IMPLANT
PENCIL SMOKE EVACUATOR (MISCELLANEOUS) IMPLANT
SET LOCALIZER 20 PROBE US (MISCELLANEOUS) IMPLANT
SPIKE FLUID TRANSFER (MISCELLANEOUS) ×2 IMPLANT
SUT MNCRL 4-0 (SUTURE) ×2
SUT MNCRL 4-0 27XMFL (SUTURE) ×2
SUT VIC AB 3-0 SH 27 (SUTURE) ×2
SUT VIC AB 3-0 SH 27X BRD (SUTURE) ×2 IMPLANT
SUTURE MNCRL 4-0 27XMF (SUTURE) ×2 IMPLANT
SYR 10ML LL (SYRINGE) ×2 IMPLANT
TOWEL OR 17X26 4PK STRL BLUE (TOWEL DISPOSABLE) IMPLANT
TRAP FLUID SMOKE EVACUATOR (MISCELLANEOUS) ×2 IMPLANT
TRAP NEPTUNE SPECIMEN COLLECT (MISCELLANEOUS) ×2 IMPLANT
WATER STERILE IRR 1000ML POUR (IV SOLUTION) ×2 IMPLANT

## 2023-04-28 NOTE — Transfer of Care (Signed)
Immediate Anesthesia Transfer of Care Note  Patient: Melody Hall  Procedure(s) Performed: EXCISION OF BREAST BIOPSY, Old RF tag already placed (Left: Breast) BREAST LUMPECTOMY (Right: Breast)  Patient Location: PACU  Anesthesia Type:General  Level of Consciousness: drowsy  Airway & Oxygen Therapy: Patient Spontanous Breathing  Post-op Assessment: Report given to RN and Post -op Vital signs reviewed and stable  Post vital signs: Reviewed and stable  Last Vitals:  Vitals Value Taken Time  BP 107/77 04/28/23 1109  Temp    Pulse 90 04/28/23 1111  Resp 13 04/28/23 1111  SpO2 97 % 04/28/23 1111  Vitals shown include unfiled device data.  Last Pain:  Vitals:   04/28/23 0758  PainSc: 0-No pain         Complications: There were no known notable events for this encounter.

## 2023-04-28 NOTE — Addendum Note (Signed)
Addendum  created 04/28/23 1249 by Lysbeth Penner, CRNA   Flowsheet accepted

## 2023-04-28 NOTE — Discharge Instructions (Signed)
AMBULATORY SURGERY  ?DISCHARGE INSTRUCTIONS ? ? ?The drugs that you were given will stay in your system until tomorrow so for the next 24 hours you should not: ? ?Drive an automobile ?Make any legal decisions ?Drink any alcoholic beverage ? ? ?You may resume regular meals tomorrow.  Today it is better to start with liquids and gradually work up to solid foods. ? ?You may eat anything you prefer, but it is better to start with liquids, then soup and crackers, and gradually work up to solid foods. ? ? ?Please notify your doctor immediately if you have any unusual bleeding, trouble breathing, redness and pain at the surgery site, drainage, fever, or pain not relieved by medication. ? ? ? ?Additional Instructions: ? ? ? ?Please contact your physician with any problems or Same Day Surgery at [phone], Monday through Friday 6 am to 4 pm, or [place] at [facility] number at [phone].  ?

## 2023-04-28 NOTE — Anesthesia Procedure Notes (Signed)
Procedure Name: LMA Insertion Date/Time: 04/28/2023 9:39 AM  Performed by: Lysbeth Penner, CRNAPre-anesthesia Checklist: Patient identified, Emergency Drugs available, Suction available and Patient being monitored Patient Re-evaluated:Patient Re-evaluated prior to induction Oxygen Delivery Method: Circle system utilized Preoxygenation: Pre-oxygenation with 100% oxygen Induction Type: IV induction Ventilation: Mask ventilation without difficulty LMA: LMA inserted LMA Size: 3.0 Number of attempts: 1 Placement Confirmation: positive ETCO2 and breath sounds checked- equal and bilateral Tube secured with: Tape Dental Injury: Teeth and Oropharynx as per pre-operative assessment

## 2023-04-28 NOTE — Interval H&P Note (Signed)
History and Physical Interval Note:  04/28/2023 9:03 AM  Melody Hall  has presented today for surgery, with the diagnosis of left breast.  The various methods of treatment have been discussed with the patient and family. After consideration of risks, benefits and other options for treatment, the patient has consented to  Procedure(s): EXCISION OF BREAST BIOPSY, Old RF tag already placed (Left) BREAST LUMPECTOMY (Right) as a surgical intervention.  The patient's history has been reviewed, patient examined, no change in status, stable for surgery.  I have reviewed the patient's chart and labs.  Questions were answered to the patient's satisfaction.     Campbell Lerner

## 2023-04-28 NOTE — Op Note (Signed)
  Pre-operative Diagnosis: Complex sclerosing lesion/radial scar, flat epithelial atypia upper inner quadrant left breast.  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    Post-operative Diagnosis: Same  Surgeon: Campbell Lerner, M.D., [facility]  Anesthesia: General  Procedure: Right lumpectomy, RFID tag directed excisional biopsy left breast.   Procedure Details  The patient was seen again in the Holding Room. The benefits, complications, treatment options, and expected outcomes were discussed with the patient. The risks of bleeding, infection, recurrence of symptoms, failure to resolve symptoms, hematoma, seroma, open wound, cosmetic deformity, and the need for further surgery were discussed.  The patient was taken to Operating Room, identified as Melody Hall and the procedure verified.  A Time Out was held and the above information confirmed.  Prior to the induction of general anesthesia, antibiotic prophylaxis was administered. VTE prophylaxis was in place. The patient was positioned in the supine position. Appropriate anesthesia was then administered and tolerated well. The Hologic Localizer is used to mark the skin for incision.     Attention was turned to the RFID tag localization site where a transverse upper inner quadrant incision was made. Dissection using the Localizer probe to guide the lumpectomy with adequate margins was performed. This was done with electrocautery and sharp dissection with Mayo scissors. There was minimal bleeding, and the cavity packed.  The specimen was taken for over Faxitron evaluation.   I returned to the cavity to remove the packing, and hemostasis was confirmed with electrocautery.    Left breast wound was irrigated with normal saline and hemostasis confirmed.  I then transition to the right breast where local anesthetic was infiltrated and a similar incision was made on the upper inner quadrant of the right breast overlying the readily palpable  fibroadenomatous mass.  This mass was then encountered with electrosurgery maintaining excellent hemostasis.  It was then shelled out from the adjacent soft tissues with a combination of blunt and sharp dissection.  Hemostasis then obtained with electrocautery, and the wound is irrigated and hemostasis confirmed.  Once assuring that hemostasis was adequate and checked multiple times the wounds were closed with interrupted 3-0 Vicryl followed by 4-0 subcuticular Monocryl sutures. Dermabond is utilized to seal the incision.  Local infiltration of 0.25% Marcaine  with epi used as a depot to the biopsy cavity.    Findings: Faxitron imaging: Confirms presence of both RFID tag and initial radiographic marker, of the left upper inner quadrant breast lesion.  Estimated Blood Loss: Minimal         Drains: None         Specimens: Lumpectomy from the right breast, and upper inner quadrant left breast tissue with RFID tag marker.       Complications: None         Condition: Stable    Campbell Lerner, M.D., [facility]  04/28/2023 ; 11:29 AM

## 2023-04-28 NOTE — Anesthesia Preprocedure Evaluation (Signed)
Anesthesia Evaluation  Patient identified by MRN, date of birth, ID band Patient awake    Reviewed: Allergy & Precautions, NPO status , Patient's Chart, lab work & pertinent test results  History of Anesthesia Complications Negative for: history of anesthetic complications  Airway Mallampati: III  TM Distance: >3 FB Neck ROM: full    Dental no notable dental hx.    Pulmonary neg pulmonary ROS   Pulmonary exam normal        Cardiovascular (-) hypertensionnegative cardio ROS Normal cardiovascular exam     Neuro/Psych  PSYCHIATRIC DISORDERS  Depression    negative neurological ROS     GI/Hepatic negative GI ROS, Neg liver ROS,,,  Endo/Other  negative endocrine ROS    Renal/GU      Musculoskeletal   Abdominal   Peds  Hematology negative hematology ROS (+)   Anesthesia Other Findings Past Medical History: No date: Anemia     Comment:  2016 07/31/2018: Breast lesion No date: Chronic idiopathic urticaria No date: Depression No date: Elevated blood pressure reading No date: Fibroadenosis of breast No date: Hives No date: Hypertension No date: Mental disorder     Comment:  Depression  Past Surgical History: 08/14/2022: BREAST BIOPSY; Left     Comment:  Stereo Bx coil clip - path pending 08/14/2022: BREAST BIOPSY; Left     Comment:  [place] LT BREAST BX W LOC DEV 1ST LESION IMG BX SPEC [place]               GUIDE 08/14/2022 [facility] 08/14/2022: BREAST BIOPSY; Left     Comment:  MM LT BREAST BX W LOC DEV 1ST LESION IMAGE BX SPEC               STEREO GUIDE 08/14/2022 [facility] 08/28/2022: BREAST BIOPSY; Right     Comment:  u/s bx, 1:00 RIBBON clip-path pending 08/28/2022: BREAST BIOPSY; Left     Comment:  u/s bx, 9:00  RIBBON clip-path pending 08/28/2022: BREAST BIOPSY; Left     Comment:  [place] LT BREAST BX W LOC DEV 1ST LESION IMG BX SPEC [place]               GUIDE 08/28/2022 [facility] 08/28/2022: BREAST  BIOPSY; Right     Comment:  [place] RT BREAST BX W LOC DEV 1ST LESION IMG BX SPEC [place]               GUIDE 08/28/2022 [facility] 10/30/2022: BREAST LUMPECTOMY WITH RADIOFREQUENCY TAG IDENTIFICATION;  Left     Comment:  RF tag for coil clip CSL 2001: SALIVARY GLAND SURGERY     Comment:  stone removed  BMI    Body Mass Index: 33.64 kg/m      Reproductive/Obstetrics negative OB ROS                             Anesthesia Physical Anesthesia Plan  ASA: 2  Anesthesia Plan: General LMA   Post-op Pain Management: Tylenol PO (pre-op)*, Gabapentin PO (pre-op)* and Celebrex PO (pre-op)*   Induction: Intravenous  PONV Risk Score and Plan: 3 and Dexamethasone, Ondansetron, Midazolam and Treatment may vary due to age or medical condition  Airway Management Planned: LMA  Additional Equipment:   Intra-op Plan:   Post-operative Plan: Extubation in OR  Informed Consent: I have reviewed the patients History and Physical, chart, labs and discussed the procedure including the risks, benefits and alternatives for the proposed anesthesia with the patient or authorized  representative who has indicated his/her understanding and acceptance.     Dental Advisory Given  Plan Discussed with: Anesthesiologist, CRNA and Surgeon  Anesthesia Plan Comments: (Patient consented for risks of anesthesia including but not limited to:  - adverse reactions to medications - damage to eyes, teeth, lips or other oral mucosa - nerve damage due to positioning  - sore throat or hoarseness - Damage to heart, brain, nerves, lungs, other parts of body or loss of life  Patient voiced understanding and assent.)       Anesthesia Quick Evaluation

## 2023-04-28 NOTE — Anesthesia Postprocedure Evaluation (Signed)
Anesthesia Post Note  Patient: Teacher, early years/pre  Procedure(s) Performed: EXCISION OF BREAST BIOPSY, Old RF tag already placed (Left: Breast) BREAST LUMPECTOMY (Right: Breast)  Patient location during evaluation: PACU Anesthesia Type: General Level of consciousness: awake and alert Pain management: pain level controlled Vital Signs Assessment: post-procedure vital signs reviewed and stable Respiratory status: spontaneous breathing, nonlabored ventilation, respiratory function stable and patient connected to nasal cannula oxygen Cardiovascular status: blood pressure returned to baseline and stable Postop Assessment: no apparent nausea or vomiting Anesthetic complications: no   There were no known notable events for this encounter.   Last Vitals:  Vitals:   04/28/23 1145 04/28/23 1200  BP: (!) 124/92 (!) 133/90  Pulse: 93 88  Resp: 18 17  Temp:    SpO2: 97% 97%    Last Pain:  Vitals:   04/28/23 1210  PainSc: 4                  Louie Boston

## 2023-04-29 ENCOUNTER — Encounter: Payer: Self-pay | Admitting: Surgery

## 2023-04-29 ENCOUNTER — Other Ambulatory Visit: Payer: Self-pay | Admitting: Surgery

## 2023-04-29 MED ORDER — HYDROCODONE-ACETAMINOPHEN 5-325 MG PO TABS: 2.0000 | ORAL_TABLET | Freq: Four times a day (QID) | ORAL | 0 refills | Status: DC | PRN

## 2023-04-29 NOTE — Progress Notes (Signed)
ReRx due to [facility]

## 2023-04-30 ENCOUNTER — Telehealth: Payer: Self-pay | Admitting: Surgery

## 2023-04-30 LAB — SURGICAL PATHOLOGY

## 2023-04-30 NOTE — Telephone Encounter (Signed)
Patient calls. She had right breast surgery on 04/28/23 with Dr. Claudine Mouton  Patient states that at the surgical site there has developed an opening.  She is concerned about this.  Patient states there is no oozing or drainage. No redness or swelling at the site.

## 2023-05-01 ENCOUNTER — Encounter: Payer: Medicaid Other | Admitting: Surgery

## 2023-05-01 ENCOUNTER — Telehealth: Payer: Self-pay | Admitting: Surgery

## 2023-05-01 NOTE — Telephone Encounter (Signed)
On 04/30/23 patient calls about an opening at the surgical site on her breast.  We worked her in for a post op appointment for the following day 05/01/23. She was told to be here at 10:45 am check in for 11:00 am appointment.  Patient said she will be here.   On 05/01/23 at 10:38 am, patient calls, said she is stuck in [place], had an emergency? Patient rescheduled appointment to 05/06/23.

## 2023-05-06 ENCOUNTER — Encounter: Payer: Self-pay | Admitting: Surgery

## 2023-05-06 ENCOUNTER — Ambulatory Visit (INDEPENDENT_AMBULATORY_CARE_PROVIDER_SITE_OTHER): Payer: Medicaid Other | Admitting: Surgery

## 2023-05-06 VITALS — BP 136/89 | HR 105 | Temp 98.0°F | Ht 64.0 in | Wt 198.0 lb

## 2023-05-06 DIAGNOSIS — Z09 Encounter for follow-up examination after completed treatment for conditions other than malignant neoplasm: Secondary | ICD-10-CM

## 2023-05-06 DIAGNOSIS — D241 Benign neoplasm of right breast: Secondary | ICD-10-CM

## 2023-05-06 DIAGNOSIS — N6092 Unspecified benign mammary dysplasia of left breast: Secondary | ICD-10-CM

## 2023-05-06 NOTE — Patient Instructions (Addendum)
We will refer you to Oncology to speak about your next steps in treatment. They will call you to schedule this appointment.  We will have you follow up in 6 months with a mammogram prior and office visit.

## 2023-05-06 NOTE — Progress Notes (Signed)
[facility] POST-OP OFFICE VISIT  05/06/2023  HPI: Melody Hall is a 37 y.o. female 8 days s/p bilateral lumpectomies.  She presents today having lost all of her glue/Dermabond.  Not entirely certain as to why.  Minimal inflammatory change along the subcuticular closure.  No evidence of ecchymosis, erythema or induration.  Vital signs: BP 136/89   Pulse (!) 105   Temp 98 F (36.7 C)   Ht 5\' 4"  (1.626 m)   Wt 198 lb (89.8 kg)   LMP 04/22/2023 (Approximate) Comment: UPT neg 04/28/23  SpO2 98%   BMI 33.99 kg/m    Physical Exam: Constitutional: She appears well, alert and attentive.  Skin: As above.  SURGICAL PATHOLOGY [facility] [address], [address] [place], [place] [address] Telephone [phone] or [phone] Fax [phone]  REPORT OF SURGICAL PATHOLOGY   Accession #: [id number] Patient Name: Melody, Hall Visit # : [id number]  MRN: [id number] Physician: Campbell Lerner DOB/Age 12/11/1985 (Age: 45) Gender: F Collected Date: 04/28/2023 Received Date: 04/28/2023  FINAL DIAGNOSIS       1. Breast, lumpectomy, Left upper inner quadrant :      - FOCAL ATYPICAL DUCTAL HYPERPLASIA AND FLAT EPITHELIAL ATYPIA INVOLVING A      COMPLEX SCLEROSING LESION; MARGINS ARE NEGATIVE FOR ATYPIA.      - COLUMNAR CELL CHANGE, ADENOSIS, AND FIBROADENOMATOID CHANGE.      - BIOPSY SITE CHANGE WITH CLIP.      - SCOUT TAG PRESENT.       2. Breast, lumpectomy, Right upper inner quadrant :      - COMPLEX FIBROADENOMA.      - NEGATIVE FOR ATYPIA AND MALIGNANCY.       Diagnosis Note : This case underwent intradepartmental consultation and      Dr.Kanteti concurs with the interpretation.   Assessment/Plan: This is a 37 y.o. female 8 days s/p excision of ADH from the left upper inner quadrant.  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  I informed them I be referring them to an oncologist for that discussion, and follow-up.  We discussed a follow-up mammogram of both breasts in 6 months with an exam at that time. I believe she and the gentleman with her have had their questions fully answered, and understand the next steps in proceeding with risk reduction.   Campbell Lerner M.D., FACS 05/06/2023, 3:11 PM

## 2023-05-13 ENCOUNTER — Encounter: Payer: Medicaid Other | Admitting: Surgery

## 2023-05-14 ENCOUNTER — Inpatient Hospital Stay: Payer: Medicaid Other

## 2023-05-14 ENCOUNTER — Inpatient Hospital Stay: Payer: Medicaid Other | Admitting: Oncology

## 2023-05-19 ENCOUNTER — Other Ambulatory Visit: Payer: Self-pay | Admitting: Oncology

## 2023-05-19 ENCOUNTER — Inpatient Hospital Stay: Payer: Medicaid Other

## 2023-05-19 ENCOUNTER — Inpatient Hospital Stay: Payer: Medicaid Other | Attending: Oncology | Admitting: Oncology

## 2023-05-19 ENCOUNTER — Encounter: Payer: Self-pay | Admitting: Oncology

## 2023-05-19 VITALS — BP 119/94 | HR 98 | Temp 97.3°F | Resp 18 | Wt 203.5 lb

## 2023-05-19 DIAGNOSIS — N6022 Fibroadenosis of left breast: Secondary | ICD-10-CM | POA: Insufficient documentation

## 2023-05-19 DIAGNOSIS — N6320 Unspecified lump in the left breast, unspecified quadrant: Secondary | ICD-10-CM

## 2023-05-19 DIAGNOSIS — N6092 Unspecified benign mammary dysplasia of left breast: Secondary | ICD-10-CM

## 2023-05-19 DIAGNOSIS — N63 Unspecified lump in unspecified breast: Secondary | ICD-10-CM

## 2023-05-19 NOTE — Assessment & Plan Note (Signed)
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 Recommendation  Active Surveillance: life time annual screening mammography as well as  history and physical examination every 6 to 12 months Option of endocrine therapy Tamoxifen as chemoprevention was discussed. Rationale and side effects were reviewed with patient.   Refer to genetic counselor for genetic testing.

## 2023-05-19 NOTE — Progress Notes (Signed)
Hematology/Oncology Consult Note [phone] [phone]     REFERRING PROVIDER: Campbell Lerner, MD    CHIEF COMPLAINTS/PURPOSE OF CONSULTATION:  ADH  ASSESSMENT & PLAN:   Atypical ductal hyperplasia of left breast Atypical ductal hyperplasia is associated with a generalized, bilateral increase in breast cancer risk. Recommendation  Active Surveillance: life time annual screening mammography as well as  history and physical examination every 6 to 12 months Option of endocrine therapy Tamoxifen as chemoprevention was discussed. Rationale and side effects were reviewed with patient.   Refer to genetic counselor for genetic testing.    Orders Placed This Encounter  Procedures   Ambulatory referral to Genetics    Referral Priority:   Routine    Referral Type:   Consultation    Referral Reason:   Specialty Services Required    Referred to Provider:   Lacy Duverney T    Number of Visits Requested:   1   Follow up in January All questions were answered. The patient knows to call the clinic with any problems, questions or concerns.  Rickard Patience, MD, PhD [facility] Hematology Oncology 05/19/2023    HISTORY OF PRESENTING ILLNESS:  Melody Hall 37 y.o. female presents to establish care for [facility]  06/21/2022  Bilateral diagnostic mammogram / US showed left breast architectural distortion in the medial probably 3 o'clock, left breast 9 o'clock 4.4 x 1.3 x 2.9 cm oval mass, 3 o'clock 0.8 x  0.5 x 0.6 cm mass, 1o'clock  0.9 x 0.5 x 0.8 cm,  8 o'clock 1.1 x 0.6 x 1 cm oval hypoechoic mass. Right breast  1 o'clock 3.1 x 1.8 x 2.9 cm mass, 2 o'clock 2.4 x 0.9 x 1.6 cm mass, 10 o'clock 3.8 x 2.4 x 3.8 cm macro lobulated hypoechoic mass. Bilateral axilla is negative   08/14/22 She underwent biopsy  A. BREAST, LEFT AT 3:00, 4 CM FROM THE NIPPLE; ULTRASOUND-GUIDED CORE  NEEDLE BIOPSY:  - FRAGMENTS OF BENIGN FIBROADENOMA, WITH ASSOCIATED DYSTROPHIC  CALCIFICATIONS.  -  NEGATIVE FOR ATYPICAL PROLIFERATIVE BREAST DISEASE.   B. BREAST, LEFT UPPER INNER QUADRANT; STEREOTACTIC CORE NEEDLE BIOPSY:  - FLAT EPITHELIAL ATYPIA.  - BACKGROUND CHANGES SUGGESTIVE OF COMPLEX SCLEROSING LESION/RADIAL  SCAR, WITH ASSOCIATED COLUMNAR CELL CHANGE.  - NEGATIVE FOR CARCINOMA IN SITU AND MALIGNANCY.    08/28/22 She underwent biopsy  A. BREAST, RIGHT AT 1:00, 8 CM FROM THE NIPPLE; ULTRASOUND-GUIDED CORE  NEEDLE BIOPSY:  - FRAGMENTS OF BENIGN FIBROADENOMA.  - NEGATIVE FOR ATYPICAL PROLIFERATIVE BREAST DISEASE.   B. BREAST, LEFT AT 9:00, 3 CM FROM THE NIPPLE; ULTRASOUND-GUIDED CORE  NEEDLE BIOPSY:  - FRAGMENTS OF BENIGN FIBROADENOMA, WITH FOCAL USUAL DUCTAL HYPERPLASIA.  - NEGATIVE FOR ATYPICAL PROLIFERATIVE BREAST DISEASE.   04/28/2023 she underwent bilateral breast lumpectomy   1. Breast, lumpectomy, Left upper inner quadrant :       - FOCAL ATYPICAL DUCTAL HYPERPLASIA AND FLAT EPITHELIAL ATYPIA INVOLVING A       COMPLEX SCLEROSING LESION; MARGINS ARE NEGATIVE FOR ATYPIA.       - COLUMNAR CELL CHANGE, ADENOSIS, AND FIBROADENOMATOID CHANGE.       - BIOPSY SITE CHANGE WITH CLIP.       - SCOUT TAG PRESENT.        2. Breast, lumpectomy, Right upper inner quadrant :       - COMPLEX FIBROADENOMA.       - NEGATIVE FOR ATYPIA AND MALIGNANC   Menarche at age of 37yo First live birth at  age of 37yo OCP use: Depo for 5-10 years History of HRT use: No Denies family history of breast cancer   MEDICAL HISTORY:  Past Medical History:  Diagnosis Date   Anemia    2016   Breast lesion 07/31/2018   Chronic idiopathic urticaria    Depression    Elevated blood pressure reading    Fibroadenosis of breast    Hives    Hypertension    Mental disorder    Depression    SURGICAL HISTORY: Past Surgical History:  Procedure Laterality Date   BREAST BIOPSY Left 08/14/2022   Stereo Bx coil clip - path pending   BREAST BIOPSY Left 08/14/2022   [place] LT BREAST BX W LOC DEV 1ST  LESION IMG BX SPEC US GUIDE 08/14/2022 [facility]   BREAST BIOPSY Left 08/14/2022   MM LT BREAST BX W LOC DEV 1ST LESION IMAGE BX SPEC STEREO GUIDE 08/14/2022 [facility]   BREAST BIOPSY Right 08/28/2022   u/s bx, 1:00 RIBBON clip-path pending   BREAST BIOPSY Left 08/28/2022   u/s bx, 9:00  RIBBON clip-path pending   BREAST BIOPSY Left 08/28/2022   [place] LT BREAST BX W LOC DEV 1ST LESION IMG BX SPEC US GUIDE 08/28/2022 [facility]   BREAST BIOPSY Right 08/28/2022   [place] RT BREAST BX W LOC DEV 1ST LESION IMG BX SPEC US GUIDE 08/28/2022 [facility]   BREAST LUMPECTOMY Right 04/28/2023   Procedure: BREAST LUMPECTOMY;  Surgeon: Campbell Lerner, MD;  Location: [facility];  Service: General;  Laterality: Right;   BREAST LUMPECTOMY WITH RADIOFREQUENCY TAG IDENTIFICATION Left 10/30/2022   RF tag for coil clip CSL   EXCISION OF BREAST BIOPSY Left 04/28/2023   Procedure: EXCISION OF BREAST BIOPSY, Old RF tag already placed;  Surgeon: Campbell Lerner, MD;  Location: [facility];  Service: General;  Laterality: Left;   SALIVARY GLAND SURGERY  2001   stone removed    SOCIAL HISTORY: Social History   Socioeconomic History   Marital status: Significant Other    Spouse name: Alease Medina   Number of children: 2   Years of education: 12   Highest education level: High school graduate  Occupational History   Occupation: Not working  Tobacco Use   Smoking status: Never    Passive exposure: Never   Smokeless tobacco: Never  Vaping Use   Vaping status: Never Used  Substance and Sexual Activity   Alcohol use: Yes    Alcohol/week: 2.0 standard drinks of alcohol    Types: 2 Glasses of wine per week    Comment: occ   Drug use: Not Currently    Types: Marijuana   Sexual activity: Yes    Partners: Male  Other Topics Concern   Not on file  Social History Narrative   ** Merged History Encounter **       Social Determinants of Health   Financial Resource Strain: Not on File  (12/06/2022)   Received from [employer], [employer]    Financial Resource Strain: 0  Food Insecurity: Not at Risk (12/23/2022)   Received from [place], [place]   Food Insecurity    Food: 1  Transportation Needs: Not at Risk (12/23/2022)   Received from [place], [employer] Needs    Transportation: 1  Physical Activity: Not on File (12/06/2022)   Received from [place], [place]   Physical Activity    Physical Activity: 0  Stress: Not on File (12/06/2022)   Received from [place], [place]   Stress  Stress: 0  Social Connections: Not on File (03/18/2023)   Received from [facility]   Social Connections    Connectedness: 0  Intimate Partner Violence: Unknown (05/23/2022)   Received from [employer], [facility]   HITS    Physically Hurt: Not on file    Insult or Talk Down To: Not on file    Threaten Physical Harm: Not on file    Scream or Curse: Not on file    FAMILY HISTORY: Family History  Problem Relation Age of Onset   Healthy Paternal Grandfather    Healthy Paternal Grandmother    Healthy Maternal Grandmother    Healthy Maternal Grandfather    Healthy Father    Healthy Mother     ALLERGIES:  has No Known Allergies.  MEDICATIONS:  Current Outpatient Medications  Medication Sig Dispense Refill   diphenhydrAMINE (BENADRYL) 25 mg capsule Take 25 mg by mouth every 6 (six) hours as needed.     HYDROcodone-acetaminophen (NORCO) 5-325 MG tablet Take 2 tablets by mouth every 6 (six) hours as needed for moderate pain (pain score 4-6). 15 tablet 0   ibuprofen (ADVIL) 800 MG tablet Take 1 tablet (800 mg total) by mouth every 8 (eight) hours as needed. 30 tablet 0   No current facility-administered medications for this visit.    Review of Systems  Constitutional:  Negative for appetite change, chills, fatigue and fever.  HENT:   Negative for hearing loss and voice change.   Eyes:  Negative for eye problems.  Respiratory:  Negative for chest tightness and cough.    Cardiovascular:  Negative for chest pain.  Gastrointestinal:  Negative for abdominal distention, abdominal pain and blood in stool.  Endocrine: Negative for hot flashes.  Genitourinary:  Negative for difficulty urinating and frequency.   Musculoskeletal:  Negative for arthralgias.  Skin:  Negative for itching and rash.  Neurological:  Negative for extremity weakness.  Hematological:  Negative for adenopathy.  Psychiatric/Behavioral:  Negative for confusion.      PHYSICAL EXAMINATION: ECOG PERFORMANCE STATUS: 0 - Asymptomatic  Vitals:   05/19/23 1514  BP: (!) 119/94  Pulse: 98  Resp: 18  Temp: (!) 97.3 F (36.3 C)  SpO2: 99%   Filed Weights   05/19/23 1514  Weight: 203 lb 8 oz (92.3 kg)    Physical Exam Constitutional:      General: She is not in acute distress.    Appearance: She is not diaphoretic.  HENT:     Head: Normocephalic and atraumatic.  Eyes:     General: No scleral icterus. Cardiovascular:     Rate and Rhythm: Normal rate and regular rhythm.     Heart sounds: No murmur heard. Pulmonary:     Effort: Pulmonary effort is normal. No respiratory distress.  Abdominal:     General: There is no distension.     Palpations: Abdomen is soft.     Tenderness: There is no abdominal tenderness.  Musculoskeletal:        General: Normal range of motion.     Cervical back: Normal range of motion and neck supple.  Skin:    General: Skin is warm and dry.     Findings: No erythema.  Neurological:     Mental Status: She is alert and oriented to person, place, and time. Mental status is at baseline.     Cranial Nerves: No cranial nerve deficit.     Motor: No abnormal muscle tone.  Psychiatric:  Mood and Affect: Mood and affect normal.      LABORATORY DATA:  I have reviewed the data as listed    Latest Ref Rng & Units 02/18/2023    9:39 AM 12/06/2022   12:38 PM 11/13/2022    2:05 PM  CBC  WBC 4.0 - 10.5 K/uL 9.7  6.0  6.6   Hemoglobin 12.0 - 15.0 g/dL 40.9   81.1  91.4   Hematocrit 36.0 - 46.0 % 37.4  40.1  35.6   Platelets 150 - 400 K/uL 302  328  284       Latest Ref Rng & Units 02/18/2023    9:39 AM 12/06/2022   12:38 PM  CMP  Glucose 70 - 99 mg/dL 93  94   BUN 6 - 20 mg/dL 17  11   Creatinine 7.82 - 1.00 mg/dL 9.56  2.13   Sodium 086 - 145 mmol/L 137  138   Potassium 3.5 - 5.1 mmol/L 3.5  3.8   Chloride 98 - 111 mmol/L 106  107   CO2 22 - 32 mmol/L 23  23   Calcium 8.9 - 10.3 mg/dL 9.4  9.6   Total Protein 6.5 - 8.1 g/dL 7.4    Total Bilirubin 0.3 - 1.2 mg/dL 0.4    Alkaline Phos 38 - 126 U/L 81    AST 15 - 41 U/L 25    ALT 0 - 44 U/L 57       RADIOGRAPHIC STUDIES: I have personally reviewed the radiological images as listed and agreed with the findings in the report. MM Breast Surgical Specimen  Result Date: 04/28/2023 CLINICAL DATA:  Status post radiofrequency ID tag localized LEFT breast excisional biopsy. EXAM: SPECIMEN RADIOGRAPH OF THE LEFT BREAST COMPARISON:  Previous exam(s). FINDINGS: Status post excision of the LEFT breast. The radiofrequency ID tag and coil shaped clip are present within the specimen. IMPRESSION: Specimen radiograph of the LEFT breast. Electronically Signed   By: Sherron Ales M.D.   On: 04/28/2023 10:48

## 2023-05-28 ENCOUNTER — Inpatient Hospital Stay: Payer: Medicaid Other

## 2023-05-28 ENCOUNTER — Inpatient Hospital Stay: Payer: Medicaid Other | Admitting: Licensed Clinical Social Worker

## 2023-06-04 ENCOUNTER — Ambulatory Visit
Admission: RE | Admit: 2023-06-04 | Discharge: 2023-06-04 | Disposition: A | Discharge status: Home / Self Care | Payer: Medicaid Other | Source: Ambulatory Visit | Attending: Oncology | Admitting: Oncology

## 2023-06-04 DIAGNOSIS — N6320 Unspecified lump in the left breast, unspecified quadrant: Secondary | ICD-10-CM

## 2023-06-04 DIAGNOSIS — N63 Unspecified lump in unspecified breast: Secondary | ICD-10-CM | POA: Diagnosis present

## 2023-06-04 DIAGNOSIS — N6489 Other specified disorders of breast: Secondary | ICD-10-CM | POA: Insufficient documentation

## 2023-06-04 DIAGNOSIS — N6310 Unspecified lump in the right breast, unspecified quadrant: Secondary | ICD-10-CM | POA: Diagnosis present

## 2023-06-05 ENCOUNTER — Inpatient Hospital Stay: Payer: Medicaid Other

## 2023-06-05 ENCOUNTER — Inpatient Hospital Stay: Payer: Medicaid Other | Admitting: Licensed Clinical Social Worker

## 2023-06-16 ENCOUNTER — Ambulatory Visit: Payer: Medicaid Other | Admitting: Oncology

## 2023-06-17 ENCOUNTER — Inpatient Hospital Stay: Payer: Medicaid Other | Attending: Oncology | Admitting: Licensed Clinical Social Worker

## 2023-06-17 ENCOUNTER — Inpatient Hospital Stay: Payer: Medicaid Other

## 2023-06-17 DIAGNOSIS — N6092 Unspecified benign mammary dysplasia of left breast: Secondary | ICD-10-CM

## 2023-06-17 NOTE — Progress Notes (Signed)
REFERRING PROVIDER: Rickard Patience, MD [address] [place],  [place] [address]  PRIMARY PROVIDER:  Patient, No Pcp Per  PRIMARY REASON FOR VISIT:  1. Atypical ductal hyperplasia of left breast     HISTORY OF PRESENT ILLNESS:   Ms. Elem, a 37 y.o. female, was seen for a [facility] cancer genetics consultation at the request of Dr. Cathie Hoops due to a personal history of atypical ductal hyperplasia.  Ms. Penas presents to clinic today to discuss the possibility of a hereditary predisposition to cancer, genetic testing, and to further clarify her future cancer risks, as well as potential cancer risks for family members.   CANCER HISTORY:  Ms. Doose is a 37 y.o. female with no personal history of cancer.    RISK FACTORS:  Menarche was at age 89.  First live birth at age 89.  Ovaries intact: yes.  Hysterectomy: no.  Menopausal status: premenopausal.  HRT use: 0 years. Colonoscopy: no; not examined. Mammogram within the last year: yes. Number of breast biopsies:  pt had lumpectomy in October that showed atypical ductal hyperplasia .   Past Medical History:  Diagnosis Date   Anemia    2016   Breast lesion 07/31/2018   Chronic idiopathic urticaria    Depression    Elevated blood pressure reading    Fibroadenosis of breast    Hives    Hypertension    Mental disorder    Depression    Past Surgical History:  Procedure Laterality Date   BREAST BIOPSY Left 08/14/2022   Stereo Bx coil clip - neg   BREAST BIOPSY Left 08/14/2022   [place] LT BREAST BX W LOC DEV 1ST LESION IMG BX SPEC US GUIDE 08/14/2022 [facility]   BREAST BIOPSY Left 08/14/2022   MM LT BREAST BX W LOC DEV 1ST LESION IMAGE BX SPEC STEREO GUIDE 08/14/2022 [facility]   BREAST BIOPSY Right 08/28/2022   u/s bx, 1:00 RIBBON clip-path pending   BREAST BIOPSY Left 08/28/2022   u/s bx, 9:00  RIBBON clip-path pending   BREAST BIOPSY Left 08/28/2022   [place] LT BREAST BX W LOC DEV 1ST LESION IMG BX SPEC US GUIDE  08/28/2022 [facility]   BREAST BIOPSY Right 08/28/2022   [place] RT BREAST BX W LOC DEV 1ST LESION IMG BX SPEC US GUIDE 08/28/2022 [facility]   BREAST LUMPECTOMY Right 04/28/2023   Procedure: BREAST LUMPECTOMY;  Surgeon: Campbell Lerner, MD;  Location: [facility];  Service: General;  Laterality: Right;   BREAST LUMPECTOMY WITH RADIOFREQUENCY TAG IDENTIFICATION Left 10/30/2022   RF tag for coil clip CSL   EXCISION OF BREAST BIOPSY Left 04/28/2023   Procedure: EXCISION OF BREAST BIOPSY, Old RF tag already placed;  Surgeon: Campbell Lerner, MD;  Location: [facility];  Service: General;  Laterality: Left;   SALIVARY GLAND SURGERY  2001   stone removed    FAMILY HISTORY:  We obtained a detailed, 4-generation family history.  Significant diagnoses are listed below: Family History  Problem Relation Age of Onset   Healthy Paternal Grandfather    Healthy Paternal Grandmother    Healthy Maternal Grandmother    Healthy Maternal Grandfather    Healthy Father    Healthy Mother    Ms. Clowdus does not have any family history of cancer.  Ms. Wilhite is unaware of previous family history of genetic testing for hereditary cancer risks. There is no reported Ashkenazi Jewish ancestry. There is no known consanguinity.    GENETIC COUNSELING ASSESSMENT: Ms. Deloria is a 37 y.o.  female with a personal history of atypical ductal hyperplasia and no family history of cancer, which is not particularly suggestive of a hereditary cancer condition. We, therefore, discussed and recommended the following at today's visit.   DISCUSSION: We discussed that approximately 10% of cancer is hereditary. Most cases of hereditary breast cancer are associated with BRCA1/BRCA2 genes, although there are other genes associated with hereditary  cancer as well. Cancers and risks are gene specific. We discussed that testing is beneficial for several reasons including knowing about cancer risks, identifying potential screening and  risk-reduction options that may be appropriate, and to understand if other family members could be at risk for cancer and allow them to undergo genetic testing.   We reviewed the characteristics, features and inheritance patterns of hereditary cancer syndromes. We also discussed genetic testing, including the appropriate family members to test, the process of testing, insurance coverage and turn-around-time for results. We discussed the implications of a negative, positive and/or variant of uncertain significant result. Her family history is not suggestive of hereditary cancer as there are no known cancer diagnoses. Ms. Denke does not meet criteria for genetic testing, but would still like to undergo genetic testing as she notes it may help her decide whether to take antiestrogen therapy.  Based on Ms. Czarnecki's family history, she does not meet medical criteria for genetic testing and may have an out of pocket cost.   PLAN: After considering the risks, benefits, and limitations, Ms. Ursin provided informed consent to pursue genetic testing and the blood sample was sent to [facility] for analysis of the Common Hereditary Cancers+RNA panel. Results should be available within approximately 2-3 weeks' time, at which point they will be disclosed by telephone to Ms. Noblet, as will any additional recommendations warranted by these results. Ms. Batdorf will receive a summary of her genetic counseling visit and a copy of her results once available. This information will also be available in Epic.   Ms. Brownback questions were answered to her satisfaction today. Our contact information was provided should additional questions or concerns arise. Thank you for the referral and allowing [place] to share in the care of your patient.   Lacy Duverney, MS, [facility] Genetic Counselor [email or web address] Phone: [phone]  The patient was seen for a total of 18 minutes in face-to-face genetic  counseling.  Dr. Blake Divine was available for discussion regarding this case.   _______________________________________________________________________ For Office Staff:  Number of people involved in session: 1 Was an Intern/ student involved with case: no

## 2023-06-26 ENCOUNTER — Encounter: Payer: Self-pay | Admitting: Licensed Clinical Social Worker

## 2023-06-26 ENCOUNTER — Ambulatory Visit: Payer: Self-pay | Admitting: Licensed Clinical Social Worker

## 2023-06-26 ENCOUNTER — Telehealth: Payer: Self-pay | Admitting: Licensed Clinical Social Worker

## 2023-06-26 DIAGNOSIS — Z1379 Encounter for other screening for genetic and chromosomal anomalies: Secondary | ICD-10-CM | POA: Insufficient documentation

## 2023-06-26 NOTE — Progress Notes (Signed)
HPI:   [name] was previously seen in the [place] [facility] due to a personal history of ADH and concerns regarding a hereditary predisposition to cancer. Please refer to our prior cancer genetics clinic note for more information regarding our discussion, assessment and recommendations, at the time. [name] recent genetic test results were disclosed to her, as were recommendations warranted by these results. These results and recommendations are discussed in more detail below.  CANCER HISTORY:  Oncology History   No history exists.    Hall HISTORY:  We obtained a detailed, 4-generation Hall history.  Significant diagnoses are listed below: Hall History  Problem Relation Age of Onset   Healthy Paternal Grandfather    Healthy Paternal Grandmother    Healthy Maternal Grandmother    Healthy Maternal Grandfather    Healthy Father    Healthy Mother    [name] does not have any Hall history of cancer.   [name] is unaware of previous Hall history of genetic testing for hereditary cancer risks. There is no reported Ashkenazi Jewish ancestry. There is no known consanguinity.      GENETIC TEST RESULTS:  The [facility] Common Hereditary Cancers+RNA Panel found no pathogenic mutations.  The Common Hereditary Cancers Panel + RNA offered by [facility] includes sequencing and/or deletion duplication testing of the following 48 genes: APC*, ATM*, AXIN2, BAP1, BARD1, BMPR1A, BRCA1, BRCA2, BRIP1, CDH1, CDK4, CDKN2A (p14ARF), CDKN2A (p16INK4a), CHEK2, CTNNA1, DICER1*, EPCAM*, FH*, GREM1*, HOXB13, KIT, MBD4, MEN1*, MLH1*, MSH2*, MSH3*, MSH6*, MUTYH, NF1*, NTHL1, PALB2, PDGFRA, PMS2*, POLD1*, POLE, PTEN*, RAD51C, RAD51D, SDHA*, SDHB, SDHC*, SDHD, SMAD4, SMARCA4, STK11, TP53, TSC1*, TSC2, VHL.  The test report has been scanned into EPIC and is located under the Molecular Pathology section of the Results Review tab.  A portion of the result report is included below for  reference. Genetic testing reported out on 06/26/2023.      Even though a pathogenic variant was not identified, possible explanations for the cancer in the Hall may include: There may be no hereditary risk for cancer in the Hall. The cancers in [name] and/or her Hall may be sporadic/familial or due to other genetic and environmental factors. There may be a gene mutation in one of these genes that current testing methods cannot detect but that chance is small. There could be another gene that has not yet been discovered, or that we have not yet tested, that is responsible for the cancer diagnoses in the Hall.  It is also possible there is a hereditary cause for the cancer in the Hall that [name] did not inherit.  Therefore, it is important to remain in touch with cancer genetics in the future so that we can continue to offer [name] the most up to date genetic testing.   ADDITIONAL GENETIC TESTING:  We discussed with [name] that her genetic testing was fairly extensive.  If there are additional relevant genes identified to increase cancer risk that can be analyzed in the future, we would be happy to discuss and coordinate this testing at that time.    CANCER SCREENING RECOMMENDATIONS:  [name] test result is considered negative (normal).    An individual's cancer risk and medical management are not determined by genetic test results alone. Overall cancer risk assessment incorporates additional factors, including personal medical history, Hall history, and any available genetic information that may result in a personalized plan for cancer prevention and surveillance. Therefore, it is recommended she continue to  follow the cancer management and screening guidelines provided by her primary healthcare provider.  Based on the reported personal and Hall history, specific cancer screenings for [name] include:  Breast Cancer Screening:   The Hall model is one of multiple prediction models developed to estimate an individual's lifetime risk of developing breast cancer. The Hall model is endorsed by the [employer] (NCCN). This model includes many risk factors such as Hall history, endogenous estrogen exposure, and benign breast disease. The calculation is highly-dependent on the accuracy of clinical data provided by the patient and can change over time. The Hall model may be repeated to reflect new information in her personal or Hall history in the future.    [name] risk score is 36.7%.  For women with a greater than 20% lifetime risk of breast cancer, the NCCN recommends the following:    1.   Clinical encounter every 6-12 months to begin when identified as being at increased risk, but not before age 74    2.   Annual mammograms, tomosynthesis is recommended starting 10 years earlier than the youngest breast cancer diagnosis in the Hall or at age 74 (whichever comes first), but not before age 64     2.   Annual breast MRI starting 10 years earlier than the youngest breast cancer diagnosis in the Hall or at age 13 (whichever comes first), but not before age 38    [name] is already followed as high risk by Dr. Cathie Hoops.     RECOMMENDATIONS FOR Hall MEMBERS:   Since she did not inherit a identifiable mutation in a cancer predisposition gene included on this panel, her children could not have inherited a known mutation from her in one of these genes. Individuals in this Hall might be at some increased risk of developing cancer, over the general population risk, due to the Hall history of cancer.  Individuals in the Hall should notify their providers of the Hall history of cancer. We recommend women in this Hall have a yearly mammogram beginning at age 101, or 8 years younger than the earliest onset of cancer, an annual clinical breast exam, and  perform monthly breast self-exams.  Hall members should have colonoscopies by at age 60, or earlier, as recommended by their providers.  FOLLOW-UP:  Lastly, we discussed with [name] that cancer genetics is a rapidly advancing field and it is possible that new genetic tests will be appropriate for her and/or her Hall members in the future. We encouraged her to remain in contact with cancer genetics on an annual basis so we can update her personal and Hall histories and let her know of advances in cancer genetics that may benefit this Hall.   Our contact number was provided. Ms. Rian questions were answered to her satisfaction, and she knows she is welcome to call us at anytime with additional questions or concerns.    Lacy Duverney, MS, [facility] Genetic Counselor [email or web address] Phone: [phone]

## 2023-06-26 NOTE — Telephone Encounter (Signed)
I contacted Ms. Jersey to discuss her genetic testing results. No pathogenic variants were identified in the 48 genes analyzed. Detailed clinic note to follow.   The test report has been scanned into EPIC and is located under the Molecular Pathology section of the Results Review tab.  A portion of the result report is included below for reference.        Lacy Duverney, MS, [facility] Genetic Counselor [email or web address] Phone: [phone]

## 2023-07-01 ENCOUNTER — Ambulatory Visit (INDEPENDENT_AMBULATORY_CARE_PROVIDER_SITE_OTHER): Payer: Medicaid Other | Admitting: Surgery

## 2023-07-01 VITALS — BP 122/81 | HR 105 | Temp 98.2°F | Ht 64.0 in | Wt 202.0 lb

## 2023-07-01 DIAGNOSIS — Z09 Encounter for follow-up examination after completed treatment for conditions other than malignant neoplasm: Secondary | ICD-10-CM

## 2023-07-01 DIAGNOSIS — D241 Benign neoplasm of right breast: Secondary | ICD-10-CM

## 2023-07-01 DIAGNOSIS — N644 Mastodynia: Secondary | ICD-10-CM

## 2023-07-01 NOTE — Progress Notes (Unsigned)
[facility] POST-OP OFFICE VISIT  07/01/2023  HPI: Melody Hall is a 37 y.o. female  s/p bilateral lumpectomies.  Post-op she lost all of her glue/Dermabond prior to her office visit.  Not entirely certain as to why.   No evidence of ecchymosis, erythema or induration at that time.   Now presents with some pain of both breasts which has been going on for at least 2 weeks.  About a month ago she underwent additional breast imaging.  She denies any drainage, nor any swelling.  She reports that it does not matter whether her breasts are supported or not supported.  Denies fevers and chills.  Vital signs: BP 122/81   Pulse (!) 105   Temp 98.2 F (36.8 C)   Ht 5\' 4"  (1.626 m)   Wt 202 lb (91.6 kg)   SpO2 98%   BMI 34.67 kg/m    Physical Exam: Constitutional: She appears well, alert and attentive. [name] present as chaperone: Both scars are little wider than I would have liked to seen.  Both are supple with little underlying soft tissues scarring if any.  No evidence of seroma, hematoma, focal tenderness or skin changes.  No objective evidence of remarkable tenderness. Skin: As above.  SURGICAL PATHOLOGY [facility] [address], [address] [place], [place] [address] Telephone [phone] or [phone] Fax [phone]  REPORT OF SURGICAL PATHOLOGY   Accession #: [id number] Patient Name: Melody Hall, Melody Hall Visit # : [id number]  MRN: [id number] Physician: Campbell Lerner DOB/Age [birth year] (Age: 58) Gender: F Collected Date: 04/28/2023 Received Date: 04/28/2023  FINAL DIAGNOSIS       1. Breast, lumpectomy, Left upper inner quadrant :      - FOCAL ATYPICAL DUCTAL HYPERPLASIA AND FLAT EPITHELIAL ATYPIA INVOLVING A      COMPLEX SCLEROSING LESION; MARGINS ARE NEGATIVE FOR ATYPIA.      - COLUMNAR CELL CHANGE, ADENOSIS, AND FIBROADENOMATOID CHANGE.      - BIOPSY SITE CHANGE WITH CLIP.      - SCOUT TAG PRESENT.       2. Breast,  lumpectomy, Right upper inner quadrant :      - COMPLEX FIBROADENOMA.      - NEGATIVE FOR ATYPIA AND MALIGNANCY.       Diagnosis Note : This case underwent intradepartmental consultation and      Dr.Kanteti concurs with the interpretation.   Assessment/Plan: This is a 37 y.o. female 8 days s/p excision of ADH from the left upper inner quadrant.  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  I informed them I be referring them to an oncologist for that discussion, and follow-up.  We discussed a follow-up mammogram of both breasts in 6 months with an exam at that time. I believe she and the gentleman with her have had their questions fully answered, and understand the next steps in proceeding with risk reduction.   Campbell Lerner M.D., FACS 07/01/2023, 3:55 PM

## 2023-07-01 NOTE — Patient Instructions (Addendum)
You may try taking Ibuprofen 600 mg(3 OTC tablets) 2-3 times a day for the next two weeks.   Prevention The following steps may help prevent the causes of breast pain, although more research is needed to determine their effectiveness.  Avoid hormone therapy if possible. Avoid medications that are known to cause breast pain or make it worse. Wear a properly fitted bra, and wear a sports bra during exercise. Try relaxation therapy, which can help control the high levels of anxiety associated with severe breast pain. Limit or eliminate caffeine, a dietary change some people find helpful, although studies of caffeine's effect on breast pain and other premenstrual symptoms have been inconclusive. Avoid excessive or prolonged lifting activities. Follow a low-fat diet and eat more complex carbohydrates. Consider using an over-the-counter pain reliever, such as acetaminophen (Tylenol, others) or ibuprofen (Advil, Motrin IB, others) -- but ask your doctor how much to take, as long-term use may increase your risk of liver problems and other side effects.  You may try Evening Primrose oil supplements to help with the pain.   Follow up with Dr Cathie Hoops as scheduled.

## 2023-07-21 ENCOUNTER — Inpatient Hospital Stay: Payer: Medicaid Other | Attending: Oncology | Admitting: Oncology

## 2023-07-21 ENCOUNTER — Encounter: Payer: Self-pay | Admitting: Oncology

## 2023-07-21 VITALS — BP 136/88 | HR 94 | Temp 96.5°F | Resp 18 | Wt 209.0 lb

## 2023-07-21 DIAGNOSIS — N6022 Fibroadenosis of left breast: Secondary | ICD-10-CM | POA: Diagnosis present

## 2023-07-21 DIAGNOSIS — N644 Mastodynia: Secondary | ICD-10-CM | POA: Diagnosis not present

## 2023-07-21 DIAGNOSIS — Z79810 Long term (current) use of selective estrogen receptor modulators (SERMs): Secondary | ICD-10-CM

## 2023-07-21 DIAGNOSIS — Z9189 Other specified personal risk factors, not elsewhere classified: Secondary | ICD-10-CM | POA: Diagnosis not present

## 2023-07-21 DIAGNOSIS — F419 Anxiety disorder, unspecified: Secondary | ICD-10-CM

## 2023-07-21 DIAGNOSIS — N6092 Unspecified benign mammary dysplasia of left breast: Secondary | ICD-10-CM

## 2023-07-21 MED ORDER — TAMOXIFEN CITRATE 20 MG PO TABS: 20.0000 mg | ORAL_TABLET | Freq: Every day | ORAL | 3 refills | Status: DC

## 2023-07-21 NOTE — Progress Notes (Signed)
 Hematology/Oncology Consult Note [phone] [phone]     REFERRING PROVIDER: Lane Shope, MD    CHIEF COMPLAINTS/PURPOSE OF CONSULTATION:  ADH  ASSESSMENT & PLAN:   Atypical ductal hyperplasia of left breast Atypical ductal hyperplasia is associated with a generalized, bilateral increase in breast cancer risk. Recommendation  Active Surveillance: life time annual screening mammography as well as  history and physical examination every 6 to 12 months Option of endocrine therapy Tamoxifen  as chemoprevention was discussed. Rationale and side effects were reviewed with patient.  She agrees with the treatment. Rx sent.  Patient denies any chance of pregnancy.  She is currently not sexually active.  She understands that she needs to have contraceptive measures if she becomes sexually active while on tamoxifen .   At increased risk of breast cancer Tyrer-Cuzick risk score is 36.7%. no pathological mutation on genetic testing.  Recommend annual breast mammogram with annual breast MRI  Breast pain in female Likely due to Fibrocystic changes.  Recommend patient to decrease alcohol and caffeine intake.  Tamoxifen  may help symptoms   Orders Placed This Encounter  Procedures   MR BREAST BILATERAL W WO CONTRAST INC CAD    Standing Status:   Future    Expected Date:   11/18/2023    Expiration Date:   07/20/2024    If indicated for the ordered procedure, I authorize the administration of contrast media per Radiology protocol:   Yes    What is the patient's sedation requirement?:   No Sedation    Does the patient have a pacemaker or implanted devices?:   No    Radiology Contrast Protocol - do NOT remove file path:   [email or web address]    Preferred imaging location?:   [facility] (table limit - 550lbs)   CMP (Cancer Center only)    Standing Status:   Future    Expected Date:   10/19/2023    Expiration Date:   07/20/2024    CBC with Differential (Cancer Center Only)    Standing Status:   Future    Expected Date:   10/19/2023    Expiration Date:   07/20/2024   Ambulatory referral to Gynecology    Referral Priority:   Routine    Referral Type:   Consultation    Referral Reason:   Specialty Services Required    Requested Specialty:   Gynecology    Number of Visits Requested:   1   Ambulatory Referral to [facility]    Referral Priority:   Routine    Referral Type:   Consultation    Referral Reason:   Specialty Services Required    Requested Specialty:   Oncology    Number of Visits Requested:   1   Follow up in 3 months All questions were answered. The patient knows to call the clinic with any problems, questions or concerns.  Zelphia Cap, MD, PhD [facility] Hematology Oncology 07/21/2023    HISTORY OF PRESENTING ILLNESS:  Melody Hall 38 y.o. female presents to establish care for [facility]  06/21/2022  Bilateral diagnostic mammogram / US  showed left breast architectural distortion in the medial probably 3 o'clock, left breast 9 o'clock 4.4 x 1.3 x 2.9 cm oval mass, 3 o'clock 0.8 x  0.5 x 0.6 cm mass, 1o'clock  0.9 x 0.5 x 0.8 cm,  8 o'clock 1.1 x 0.6 x 1 cm oval hypoechoic mass. Right breast  1 o'clock 3.1 x 1.8 x 2.9 cm mass, 2 o'clock 2.4 x 0.9 x 1.6  cm mass, 10 o'clock 3.8 x 2.4 x 3.8 cm macro lobulated hypoechoic mass. Bilateral axilla is negative   08/14/22 She underwent biopsy  A. BREAST, LEFT AT 3:00, 4 CM FROM THE NIPPLE; ULTRASOUND-GUIDED CORE  NEEDLE BIOPSY:  - FRAGMENTS OF BENIGN FIBROADENOMA, WITH ASSOCIATED DYSTROPHIC  CALCIFICATIONS.  - NEGATIVE FOR ATYPICAL PROLIFERATIVE BREAST DISEASE.   B. BREAST, LEFT UPPER INNER QUADRANT; STEREOTACTIC CORE NEEDLE BIOPSY:  - FLAT EPITHELIAL ATYPIA.  - BACKGROUND CHANGES SUGGESTIVE OF COMPLEX SCLEROSING LESION/RADIAL  SCAR, WITH ASSOCIATED COLUMNAR CELL CHANGE.  - NEGATIVE FOR CARCINOMA IN SITU AND MALIGNANCY.    08/28/22 She underwent biopsy  A.  BREAST, RIGHT AT 1:00, 8 CM FROM THE NIPPLE; ULTRASOUND-GUIDED CORE  NEEDLE BIOPSY:  - FRAGMENTS OF BENIGN FIBROADENOMA.  - NEGATIVE FOR ATYPICAL PROLIFERATIVE BREAST DISEASE.   B. BREAST, LEFT AT 9:00, 3 CM FROM THE NIPPLE; ULTRASOUND-GUIDED CORE  NEEDLE BIOPSY:  - FRAGMENTS OF BENIGN FIBROADENOMA, WITH FOCAL USUAL DUCTAL HYPERPLASIA.  - NEGATIVE FOR ATYPICAL PROLIFERATIVE BREAST DISEASE.   04/28/2023 she underwent bilateral breast lumpectomy   1. Breast, lumpectomy, Left upper inner quadrant :       - FOCAL ATYPICAL DUCTAL HYPERPLASIA AND FLAT EPITHELIAL ATYPIA INVOLVING A       COMPLEX SCLEROSING LESION; MARGINS ARE NEGATIVE FOR ATYPIA.       - COLUMNAR CELL CHANGE, ADENOSIS, AND FIBROADENOMATOID CHANGE.       - BIOPSY SITE CHANGE WITH CLIP.       - SCOUT TAG PRESENT.        2. Breast, lumpectomy, Right upper inner quadrant :       - COMPLEX FIBROADENOMA.       - NEGATIVE FOR ATYPIA AND MALIGNANC   Menarche at age of 38yo First live birth at age of 38yo OCP use: Depo for 5-10 years History of HRT use: No Denies family history of breast cancer  INTERVAL HISTORY Melody Hall is a 38 y.o. female who has above history reviewed by me today presents for follow up visit for atypical ductal hyperplasia.  At risk of breast risk. During interval, patient has had genetic testing and was negative for pathological mutation. Tyrer-Cuzick risk score is 36.7%.   Patient reports bilateral breast pain, especially at night.  Beginning of November.  MEDICAL HISTORY:  Past Medical History:  Diagnosis Date   Anemia    2016   Breast lesion 07/31/2018   Chronic idiopathic urticaria    Depression    Elevated blood pressure reading    Fibroadenosis of breast    Hives    Hypertension    Mental disorder    Depression    SURGICAL HISTORY: Past Surgical History:  Procedure Laterality Date   BREAST BIOPSY Left 08/14/2022   Stereo Bx coil clip - neg   BREAST BIOPSY Left 08/14/2022   US   LT BREAST BX W LOC DEV 1ST LESION IMG BX SPEC US  GUIDE 08/14/2022 [facility]   BREAST BIOPSY Left 08/14/2022   MM LT BREAST BX W LOC DEV 1ST LESION IMAGE BX SPEC STEREO GUIDE 08/14/2022 [facility]   BREAST BIOPSY Right 08/28/2022   u/s bx, 1:00 RIBBON clip-path pending   BREAST BIOPSY Left 08/28/2022   u/s bx, 9:00  RIBBON clip-path pending   BREAST BIOPSY Left 08/28/2022   US  LT BREAST BX W LOC DEV 1ST LESION IMG BX SPEC US  GUIDE 08/28/2022 [facility]   BREAST BIOPSY Right 08/28/2022   US  RT BREAST BX W LOC DEV 1ST LESION IMG BX  SPEC US  GUIDE 08/28/2022 [facility]   BREAST LUMPECTOMY Right 04/28/2023   Procedure: BREAST LUMPECTOMY;  Surgeon: Lane Shope, MD;  Location: [facility];  Service: General;  Laterality: Right;   BREAST LUMPECTOMY WITH RADIOFREQUENCY TAG IDENTIFICATION Left 10/30/2022   RF tag for coil clip CSL   EXCISION OF BREAST BIOPSY Left 04/28/2023   Procedure: EXCISION OF BREAST BIOPSY, Old RF tag already placed;  Surgeon: Lane Shope, MD;  Location: [facility];  Service: General;  Laterality: Left;   SALIVARY GLAND SURGERY  2001   stone removed    SOCIAL HISTORY: Social History   Socioeconomic History   Marital status: Significant Other    Spouse name: Selinda Hopping   Number of children: 2   Years of education: 12   Highest education level: High school graduate  Occupational History   Occupation: Not working  Tobacco Use   Smoking status: Never    Passive exposure: Never   Smokeless tobacco: Never  Vaping Use   Vaping status: Never Used  Substance and Sexual Activity   Alcohol use: Yes    Alcohol/week: 2.0 standard drinks of alcohol    Types: 2 Glasses of wine per week    Comment: occ   Drug use: Not Currently    Types: Marijuana   Sexual activity: Yes    Partners: Male  Other Topics Concern   Not on file  Social History Narrative   ** Merged History Encounter **       Social Drivers of Health   Financial Resource  Strain: Low Risk  (05/19/2023)   Overall Financial Resource Strain (CARDIA)    Difficulty of Paying Living Expenses: Not very hard  Food Insecurity: No Food Insecurity (05/19/2023)   Hunger Vital Sign    Worried About Running Out of Food in the Last Year: Never true    Ran Out of Food in the Last Year: Never true  Transportation Needs: Not at Risk (12/23/2022)   Received from [place] [employer] Needs    Transportation: 1  Physical Activity: Not on File (12/06/2022)   Received from [place], [place]   Physical Activity    Physical Activity: 0  Stress: Not on File (12/06/2022)   Received from [facility], [place]   Stress    Stress: 0  Social Connections: Not on File (03/18/2023)   Received from [facility]   Social Connections    Connectedness: 0  Intimate Partner Violence: Unknown (05/23/2022)   Received from [employer], [facility]   HITS    Physically Hurt: Not on file    Insult or Talk Down To: Not on file    Threaten Physical Harm: Not on file    Scream or Curse: Not on file    FAMILY HISTORY: Family History  Problem Relation Age of Onset   Healthy Paternal Grandfather    Healthy Paternal Grandmother    Healthy Maternal Grandmother    Healthy Maternal Grandfather    Healthy Father    Healthy Mother     ALLERGIES:  has no known allergies.  MEDICATIONS:  Current Outpatient Medications  Medication Sig Dispense Refill   acetaminophen  (TYLENOL ) 325 MG tablet Take 650 mg by mouth every 6 (six) hours as needed.     tamoxifen  (NOLVADEX ) 20 MG tablet Take 1 tablet (20 mg total) by mouth daily. 30 tablet 3   No current facility-administered medications for this visit.    Review of Systems  Constitutional:  Negative for appetite change, chills, fatigue and fever.  HENT:   Negative for hearing loss and voice change.   Eyes:  Negative for eye problems.  Respiratory:  Negative for chest tightness and cough.   Cardiovascular:  Negative for chest pain.  Gastrointestinal:   Negative for abdominal distention, abdominal pain and blood in stool.  Endocrine: Negative for hot flashes.  Genitourinary:  Negative for difficulty urinating and frequency.   Musculoskeletal:  Negative for arthralgias.  Skin:  Negative for itching and rash.  Neurological:  Negative for extremity weakness.  Hematological:  Negative for adenopathy.  Psychiatric/Behavioral:  Negative for confusion.      PHYSICAL EXAMINATION: ECOG PERFORMANCE STATUS: 0 - Asymptomatic  Vitals:   07/21/23 1405 07/21/23 1423  BP: (!) 121/101 136/88  Pulse: 94   Resp: 18   Temp: (!) 96.5 F (35.8 C)   SpO2: 99%    Filed Weights   07/21/23 1405  Weight: 209 lb (94.8 kg)    Physical Exam Constitutional:      General: She is not in acute distress.    Appearance: She is not diaphoretic.  HENT:     Head: Normocephalic and atraumatic.  Eyes:     General: No scleral icterus. Cardiovascular:     Rate and Rhythm: Normal rate and regular rhythm.     Heart sounds: No murmur heard. Pulmonary:     Effort: Pulmonary effort is normal. No respiratory distress.  Abdominal:     General: There is no distension.     Palpations: Abdomen is soft.     Tenderness: There is no abdominal tenderness.  Musculoskeletal:        General: Normal range of motion.     Cervical back: Normal range of motion and neck supple.  Skin:    General: Skin is warm and dry.     Findings: No erythema.  Neurological:     Mental Status: She is alert and oriented to person, place, and time. Mental status is at baseline.     Cranial Nerves: No cranial nerve deficit.     Motor: No abnormal muscle tone.  Psychiatric:        Mood and Affect: Mood and affect normal.      LABORATORY DATA:  I have reviewed the data as listed    Latest Ref Rng & Units 02/18/2023    9:39 AM 12/06/2022   12:38 PM 11/13/2022    2:05 PM  CBC  WBC 4.0 - 10.5 K/uL 9.7  6.0  6.6   Hemoglobin 12.0 - 15.0 g/dL 86.7  85.5  87.1   Hematocrit 36.0 - 46.0 % 37.4   40.1  35.6   Platelets 150 - 400 K/uL 302  328  284       Latest Ref Rng & Units 02/18/2023    9:39 AM 12/06/2022   12:38 PM  CMP  Glucose 70 - 99 mg/dL 93  94   BUN 6 - 20 mg/dL 17  11   Creatinine 9.55 - 1.00 mg/dL 9.24  9.07   Sodium 864 - 145 mmol/L 137  138   Potassium 3.5 - 5.1 mmol/L 3.5  3.8   Chloride 98 - 111 mmol/L 106  107   CO2 22 - 32 mmol/L 23  23   Calcium 8.9 - 10.3 mg/dL 9.4  9.6   Total Protein 6.5 - 8.1 g/dL 7.4    Total Bilirubin 0.3 - 1.2 mg/dL 0.4    Alkaline Phos 38 - 126 U/L 81    AST 15 - 41  U/L 25    ALT 0 - 44 U/L 57       RADIOGRAPHIC STUDIES: I have personally reviewed the radiological images as listed and agreed with the findings in the report. No results found.

## 2023-07-21 NOTE — Assessment & Plan Note (Signed)
 Likely due to Fibrocystic changes.  Recommend patient to decrease alcohol and caffeine intake.  Tamoxifen may help symptoms

## 2023-07-21 NOTE — Assessment & Plan Note (Addendum)
 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 Recommendation  Active Surveillance: life time annual screening mammography as well as  history and physical examination every 6 to 12 months Option of endocrine therapy Tamoxifen  as chemoprevention was discussed. Rationale and side effects were reviewed with patient.  She agrees with the treatment. Rx sent.  Patient denies any chance of pregnancy.  She is currently not sexually active.  She understands that she needs to have contraceptive measures if she becomes sexually active while on tamoxifen .

## 2023-07-21 NOTE — Assessment & Plan Note (Signed)
 Tyrer-Cuzick risk score is 36.7%. no pathological mutation on genetic testing.  Recommend annual breast mammogram with annual breast MRI

## 2023-07-30 NOTE — Progress Notes (Signed)
 Patient consented and enrolled to [facility] on 07/30/23. Patient scheduled initial behavioral health evaluation for 08/08/23.

## 2023-08-11 ENCOUNTER — Encounter: Payer: Medicaid Other | Admitting: Obstetrics

## 2023-08-13 ENCOUNTER — Encounter: Payer: Self-pay | Admitting: Obstetrics

## 2023-08-18 DIAGNOSIS — N6092 Unspecified benign mammary dysplasia of left breast: Secondary | ICD-10-CM | POA: Diagnosis not present

## 2023-08-18 DIAGNOSIS — F332 Major depressive disorder, recurrent severe without psychotic features: Secondary | ICD-10-CM | POA: Diagnosis not present

## 2023-09-04 ENCOUNTER — Encounter: Payer: Medicaid Other | Admitting: Obstetrics and Gynecology

## 2023-09-04 DIAGNOSIS — Z7689 Persons encountering health services in other specified circumstances: Secondary | ICD-10-CM

## 2023-09-04 DIAGNOSIS — Z124 Encounter for screening for malignant neoplasm of cervix: Secondary | ICD-10-CM

## 2023-09-13 DIAGNOSIS — N6092 Unspecified benign mammary dysplasia of left breast: Secondary | ICD-10-CM | POA: Diagnosis not present

## 2023-09-13 DIAGNOSIS — F332 Major depressive disorder, recurrent severe without psychotic features: Secondary | ICD-10-CM | POA: Diagnosis not present

## 2023-09-13 NOTE — Progress Notes (Signed)
 [facility] Final Intake Summary  Melody Hall (1986/01/05) is a 38 y/o black female with atypical ductal hyperplasia of left breast presently on chemo-prevention and in active surveillance, referred by Dr. Cathie Hoops @ [facility]. Melody Hall is open to psychiatric medication recommendations.  Assessments:  Melody Hall scored PHQ-9=18 (moderately severe), reporting anhedonia, hopelessness/purposelessness, hypersomnia, and passive suicidal ideation.  Safety Concerns: Melody Hall scored Low-Risk on the CSSR-S. She denied thinking of/wanting to hurt herself. She endorses passive SI involving thoughts of wanting to "get away from" life stressors tied to work, family, etc. No prior attempts reported.  Mania screening: Melody Hall reported a lifelong history of mood swings and periods of needing less sleep, with rapid switches between ups and downs, w/o sustained "up" periods beyond 2-3 days.  Melody Hall scored GAD-7=16 (severe), reporting anxiety around stressors like employment & custody battle/dealing with her kids' father. She considers herself an "over-thinker" by nature. Melody Hall additionally reported a sense of "paranoia", feeling like people are talking about her or out to get her.   Psych Tx/Hx: None reported.  Psychosocial Hx: Melody Hall lives with her paternal grandmother, though she would like to get her own place. She has 2 children, ages 74 and 72, and currently navigating custody with the father. Melody Hall has been working on [employer].

## 2023-09-16 ENCOUNTER — Other Ambulatory Visit: Payer: Self-pay | Admitting: Oncology

## 2023-09-16 ENCOUNTER — Encounter: Payer: Medicaid Other | Admitting: Obstetrics

## 2023-09-19 ENCOUNTER — Telehealth: Payer: Self-pay | Admitting: *Deleted

## 2023-09-19 NOTE — Telephone Encounter (Signed)
90 day supply has been sent.

## 2023-09-19 NOTE — Telephone Encounter (Signed)
 She wants to have a 3 month supply so that she does not have to go to the pharmacy every month,. She wants the to use [facility]  [address] in [place]

## 2023-09-22 ENCOUNTER — Ambulatory Visit

## 2023-09-23 ENCOUNTER — Encounter: Payer: Medicaid Other | Admitting: Obstetrics and Gynecology

## 2023-09-23 DIAGNOSIS — Z124 Encounter for screening for malignant neoplasm of cervix: Secondary | ICD-10-CM

## 2023-09-23 DIAGNOSIS — Z01419 Encounter for gynecological examination (general) (routine) without abnormal findings: Secondary | ICD-10-CM

## 2023-09-23 DIAGNOSIS — N6092 Unspecified benign mammary dysplasia of left breast: Secondary | ICD-10-CM

## 2023-09-24 ENCOUNTER — Encounter: Payer: Self-pay | Admitting: Obstetrics and Gynecology

## 2023-10-13 DIAGNOSIS — F332 Major depressive disorder, recurrent severe without psychotic features: Secondary | ICD-10-CM | POA: Insufficient documentation

## 2023-10-20 ENCOUNTER — Encounter: Payer: Self-pay | Admitting: Oncology

## 2023-10-20 ENCOUNTER — Other Ambulatory Visit: Payer: Self-pay

## 2023-10-20 ENCOUNTER — Inpatient Hospital Stay (HOSPITAL_BASED_OUTPATIENT_CLINIC_OR_DEPARTMENT_OTHER): Payer: Medicaid Other | Admitting: Oncology

## 2023-10-20 ENCOUNTER — Inpatient Hospital Stay: Payer: Medicaid Other | Attending: Oncology

## 2023-10-20 VITALS — BP 117/91 | HR 82 | Temp 96.0°F | Resp 18 | Wt 199.4 lb

## 2023-10-20 DIAGNOSIS — N6320 Unspecified lump in the left breast, unspecified quadrant: Secondary | ICD-10-CM | POA: Diagnosis not present

## 2023-10-20 DIAGNOSIS — N6311 Unspecified lump in the right breast, upper outer quadrant: Secondary | ICD-10-CM | POA: Diagnosis not present

## 2023-10-20 DIAGNOSIS — N6310 Unspecified lump in the right breast, unspecified quadrant: Secondary | ICD-10-CM | POA: Insufficient documentation

## 2023-10-20 DIAGNOSIS — Z9189 Other specified personal risk factors, not elsewhere classified: Secondary | ICD-10-CM

## 2023-10-20 DIAGNOSIS — N6312 Unspecified lump in the right breast, upper inner quadrant: Secondary | ICD-10-CM | POA: Insufficient documentation

## 2023-10-20 DIAGNOSIS — N6092 Unspecified benign mammary dysplasia of left breast: Secondary | ICD-10-CM | POA: Diagnosis not present

## 2023-10-20 DIAGNOSIS — N6022 Fibroadenosis of left breast: Secondary | ICD-10-CM | POA: Insufficient documentation

## 2023-10-20 DIAGNOSIS — N644 Mastodynia: Secondary | ICD-10-CM | POA: Insufficient documentation

## 2023-10-20 DIAGNOSIS — N63 Unspecified lump in unspecified breast: Secondary | ICD-10-CM

## 2023-10-20 LAB — CBC WITH DIFFERENTIAL (CANCER CENTER ONLY)
Abs Immature Granulocytes: 0.01 10*3/uL (ref 0.00–0.07)
Basophils Absolute: 0 10*3/uL (ref 0.0–0.1)
Basophils Relative: 1 %
Eosinophils Absolute: 0.1 10*3/uL (ref 0.0–0.5)
Eosinophils Relative: 3 %
HCT: 36.6 % (ref 36.0–46.0)
Hemoglobin: 13.5 g/dL (ref 12.0–15.0)
Immature Granulocytes: 0 %
Lymphocytes Relative: 46 %
Lymphs Abs: 2 10*3/uL (ref 0.7–4.0)
MCH: 29.9 pg (ref 26.0–34.0)
MCHC: 36.9 g/dL — ABNORMAL HIGH (ref 30.0–36.0)
MCV: 81.2 fL (ref 80.0–100.0)
Monocytes Absolute: 0.2 10*3/uL (ref 0.1–1.0)
Monocytes Relative: 6 %
Neutro Abs: 1.9 10*3/uL (ref 1.7–7.7)
Neutrophils Relative %: 44 %
Platelet Count: 255 10*3/uL (ref 150–400)
RBC: 4.51 MIL/uL (ref 3.87–5.11)
RDW: 13.5 % (ref 11.5–15.5)
WBC Count: 4.4 10*3/uL (ref 4.0–10.5)
nRBC: 0 % (ref 0.0–0.2)

## 2023-10-20 LAB — CMP (CANCER CENTER ONLY)
ALT: 31 U/L (ref 0–44)
AST: 22 U/L (ref 15–41)
Albumin: 4.1 g/dL (ref 3.5–5.0)
Alkaline Phosphatase: 60 U/L (ref 38–126)
Anion gap: 5 (ref 5–15)
BUN: 8 mg/dL (ref 6–20)
CO2: 23 mmol/L (ref 22–32)
Calcium: 9.1 mg/dL (ref 8.9–10.3)
Chloride: 105 mmol/L (ref 98–111)
Creatinine: 0.58 mg/dL (ref 0.44–1.00)
GFR, Estimated: >60 mL/min (ref >60)
Glucose, Bld: 125 mg/dL — ABNORMAL HIGH (ref 70–99)
Potassium: 4 mmol/L (ref 3.5–5.1)
Sodium: 133 mmol/L — ABNORMAL LOW (ref 135–145)
Total Bilirubin: 0.7 mg/dL (ref 0.0–1.2)
Total Protein: 7.2 g/dL (ref 6.5–8.1)

## 2023-10-20 NOTE — Assessment & Plan Note (Signed)
 Tyrer-Cuzick risk score is 36.7%. no pathological mutation on genetic testing.  Recommend annual breast mammogram with annual breast MRI

## 2023-10-20 NOTE — Assessment & Plan Note (Signed)
 Obtain right diagnostic mammogram

## 2023-10-20 NOTE — Progress Notes (Signed)
 Hematology/Oncology Consult Note [phone] [phone]     REFERRING PROVIDER: Campbell Lerner, MD    CHIEF COMPLAINTS/PURPOSE OF CONSULTATION:  ADH  ASSESSMENT & PLAN:   Atypical ductal hyperplasia of left breast Atypical ductal hyperplasia is associated with a generalized, bilateral increase in breast cancer risk. Recommendation  Active Surveillance: life time annual  mammography  Continue Tamoxifen 20mg  daily.  Recommend Gyn evaluation once year.   At increased risk of breast cancer Tyrer-Cuzick risk score is 36.7%. no pathological mutation on genetic testing.  Recommend annual breast mammogram with annual breast MRI  Breast pain in female Likely due to Fibrocystic changes.  Recommend patient to decrease alcohol and caffeine intake.  Tamoxifen may help symptoms  Breast mass, right Obtain right diagnostic mammogram   Orders Placed This Encounter  Procedures   MM 3D DIAGNOSTIC MAMMOGRAM BILATERAL BREAST    Standing Status:   Future    Expected Date:   05/18/2024    Expiration Date:   10/19/2024    Reason for Exam (SYMPTOM  OR DIAGNOSIS REQUIRED):   Breast cancer    Is the patient pregnant?:   No    Preferred imaging location?:   [facility]   [place] LIMITED ULTRASOUND INCLUDING AXILLA LEFT BREAST     Standing Status:   Future    Expected Date:   05/18/2024    Expiration Date:   10/19/2024    Reason for Exam (SYMPTOM  OR DIAGNOSIS REQUIRED):   hx breast cancer    Preferred Imaging Location?:   [facility]   [place] LIMITED ULTRASOUND INCLUDING AXILLA RIGHT BREAST    Standing Status:   Future    Expected Date:   05/18/2024    Expiration Date:   10/19/2024    Reason for Exam (SYMPTOM  OR DIAGNOSIS REQUIRED):   new RT BR MASS    Preferred Imaging Location?:   [facility]   MM 3D DIAGNOSTIC MAMMOGRAM UNILATERAL RIGHT BREAST    Standing Status:   Future    Expected Date:   10/27/2023    Expiration Date:   10/19/2024    Reason for Exam  (SYMPTOM  OR DIAGNOSIS REQUIRED):   new Right breast mass at 10:00    Is the patient pregnant?:   No    Preferred imaging location?:   [facility]   CBC with Differential (Cancer Center Only)    Standing Status:   Future    Expected Date:   05/24/2024    Expiration Date:   10/19/2024   CMP (Cancer Center only)    Standing Status:   Future    Expected Date:   05/24/2024    Expiration Date:   10/19/2024   Follow up in 6 months All questions were answered. The patient knows to call the clinic with any problems, questions or concerns.  Rickard Patience, MD, PhD [facility] Hematology Oncology 10/20/2023    HISTORY OF PRESENTING ILLNESS:  Melody Hall 38 y.o. female presents to establish care for [facility]  06/21/2022  Bilateral diagnostic mammogram / US showed left breast architectural distortion in the medial probably 3 o'clock, left breast 9 o'clock 4.4 x 1.3 x 2.9 cm oval mass, 3 o'clock 0.8 x  0.5 x 0.6 cm mass, 1o'clock  0.9 x 0.5 x 0.8 cm,  8 o'clock 1.1 x 0.6 x 1 cm oval hypoechoic mass. Right breast  1 o'clock 3.1 x 1.8 x 2.9 cm mass, 2 o'clock 2.4 x 0.9 x 1.6 cm mass, 10 o'clock 3.8 x  2.4 x 3.8 cm macro lobulated hypoechoic mass. Bilateral axilla is negative   08/14/22 She underwent biopsy  A. BREAST, LEFT AT 3:00, 4 CM FROM THE NIPPLE; ULTRASOUND-GUIDED CORE  NEEDLE BIOPSY:  - FRAGMENTS OF BENIGN FIBROADENOMA, WITH ASSOCIATED DYSTROPHIC  CALCIFICATIONS.  - NEGATIVE FOR ATYPICAL PROLIFERATIVE BREAST DISEASE.   B. BREAST, LEFT UPPER INNER QUADRANT; STEREOTACTIC CORE NEEDLE BIOPSY:  - FLAT EPITHELIAL ATYPIA.  - BACKGROUND CHANGES SUGGESTIVE OF COMPLEX SCLEROSING LESION/RADIAL  SCAR, WITH ASSOCIATED COLUMNAR CELL CHANGE.  - NEGATIVE FOR CARCINOMA IN SITU AND MALIGNANCY.    08/28/22 She underwent biopsy  A. BREAST, RIGHT AT 1:00, 8 CM FROM THE NIPPLE; ULTRASOUND-GUIDED CORE  NEEDLE BIOPSY:  - FRAGMENTS OF BENIGN FIBROADENOMA.  - NEGATIVE FOR ATYPICAL PROLIFERATIVE BREAST DISEASE.    B. BREAST, LEFT AT 9:00, 3 CM FROM THE NIPPLE; ULTRASOUND-GUIDED CORE  NEEDLE BIOPSY:  - FRAGMENTS OF BENIGN FIBROADENOMA, WITH FOCAL USUAL DUCTAL HYPERPLASIA.  - NEGATIVE FOR ATYPICAL PROLIFERATIVE BREAST DISEASE.   04/28/2023 she underwent bilateral breast lumpectomy   1. Breast, lumpectomy, Left upper inner quadrant :       - FOCAL ATYPICAL DUCTAL HYPERPLASIA AND FLAT EPITHELIAL ATYPIA INVOLVING A       COMPLEX SCLEROSING LESION; MARGINS ARE NEGATIVE FOR ATYPIA.       - COLUMNAR CELL CHANGE, ADENOSIS, AND FIBROADENOMATOID CHANGE.       - BIOPSY SITE CHANGE WITH CLIP.       - SCOUT TAG PRESENT.        2. Breast, lumpectomy, Right upper inner quadrant :       - COMPLEX FIBROADENOMA.       - NEGATIVE FOR ATYPIA AND MALIGNANC   Menarche at age of 38yo First live birth at age of 38yo OCP use: Depo for 5-10 years History of HRT use: No Denies family history of breast cancer  patient has had genetic testing and was negative for pathological mutation. Tyrer-Cuzick risk score is 36.7%.    INTERVAL HISTORY Melody Hall is a 38 y.o. female who has above history reviewed by me today presents for follow up visit for atypical ductal hyperplasia.  At risk of breast risk. Patient reports bilateral breast pain, especially at night.,  Intermittent. No new complaints.  She tolerates tamoxifen well.  MEDICAL HISTORY:  Past Medical History:  Diagnosis Date   Anemia    2016   Breast lesion 07/31/2018   Chronic idiopathic urticaria    Depression    Elevated blood pressure reading    Fibroadenosis of breast    Hives    Hypertension    Mental disorder    Depression    SURGICAL HISTORY: Past Surgical History:  Procedure Laterality Date   BREAST BIOPSY Left 08/14/2022   Stereo Bx coil clip - neg   BREAST BIOPSY Left 08/14/2022   [place] LT BREAST BX W LOC DEV 1ST LESION IMG BX SPEC US GUIDE 08/14/2022 [facility]   BREAST BIOPSY Left 08/14/2022   MM LT BREAST BX W LOC DEV 1ST  LESION IMAGE BX SPEC STEREO GUIDE 08/14/2022 [facility]   BREAST BIOPSY Right 08/28/2022   u/s bx, 1:00 RIBBON clip-path pending   BREAST BIOPSY Left 08/28/2022   u/s bx, 9:00  RIBBON clip-path pending   BREAST BIOPSY Left 08/28/2022   [place] LT BREAST BX W LOC DEV 1ST LESION IMG BX SPEC US GUIDE 08/28/2022 [facility]   BREAST BIOPSY Right 08/28/2022   [place] RT BREAST BX W LOC DEV 1ST LESION IMG BX SPEC  US GUIDE 08/28/2022 [facility]   BREAST LUMPECTOMY Right 04/28/2023   Procedure: BREAST LUMPECTOMY;  Surgeon: Campbell Lerner, MD;  Location: [facility];  Service: General;  Laterality: Right;   BREAST LUMPECTOMY WITH RADIOFREQUENCY TAG IDENTIFICATION Left 10/30/2022   RF tag for coil clip CSL   EXCISION OF BREAST BIOPSY Left 04/28/2023   Procedure: EXCISION OF BREAST BIOPSY, Old RF tag already placed;  Surgeon: Campbell Lerner, MD;  Location: [facility];  Service: General;  Laterality: Left;   SALIVARY GLAND SURGERY  2001   stone removed    SOCIAL HISTORY: Social History   Socioeconomic History   Marital status: Significant Other    Spouse name: Alease Medina   Number of children: 2   Years of education: 12   Highest education level: High school graduate  Occupational History   Occupation: Not working  Tobacco Use   Smoking status: Never    Passive exposure: Never   Smokeless tobacco: Never  Vaping Use   Vaping status: Never Used  Substance and Sexual Activity   Alcohol use: Yes    Alcohol/week: 2.0 standard drinks of alcohol    Types: 2 Glasses of wine per week    Comment: occ   Drug use: Not Currently    Types: Marijuana   Sexual activity: Yes    Partners: Male  Other Topics Concern   Not on file  Social History Narrative   ** Merged History Encounter **       Social Drivers of Health   Financial Resource Strain: Low Risk  (05/19/2023)   Overall Financial Resource Strain (CARDIA)    Difficulty of Paying Living Expenses: Not very hard  Food Insecurity: No  Food Insecurity (05/19/2023)   Hunger Vital Sign    Worried About Running Out of Food in the Last Year: Never true    Ran Out of Food in the Last Year: Never true  Transportation Needs: Not at Risk (12/23/2022)   Received from [place], [employer] Needs    Transportation: 1  Physical Activity: Not on File (12/06/2022)   Received from [place], [place]   Physical Activity    Physical Activity: 0  Stress: Not on File (12/06/2022)   Received from [facility], [place]   Stress    Stress: 0  Social Connections: Not on File (03/18/2023)   Received from [facility]   Social Connections    Connectedness: 0  Intimate Partner Violence: Unknown (05/23/2022)   Received from [employer], [facility]   HITS    Physically Hurt: Not on file    Insult or Talk Down To: Not on file    Threaten Physical Harm: Not on file    Scream or Curse: Not on file    FAMILY HISTORY: Family History  Problem Relation Age of Onset   Healthy Paternal Grandfather    Healthy Paternal Grandmother    Healthy Maternal Grandmother    Healthy Maternal Grandfather    Healthy Father    Healthy Mother     ALLERGIES:  has no known allergies.  MEDICATIONS:  Current Outpatient Medications  Medication Sig Dispense Refill   acetaminophen (TYLENOL) 325 MG tablet Take 650 mg by mouth every 6 (six) hours as needed.     tamoxifen (NOLVADEX) 20 MG tablet TAKE 1 TABLET BY MOUTH EVERY DAY 90 tablet 2   No current facility-administered medications for this visit.    Review of Systems  Constitutional:  Negative for appetite change, chills, fatigue and fever.  HENT:  Negative for hearing loss and voice change.   Eyes:  Negative for eye problems.  Respiratory:  Negative for chest tightness and cough.   Cardiovascular:  Negative for chest pain.  Gastrointestinal:  Negative for abdominal distention, abdominal pain and blood in stool.  Endocrine: Negative for hot flashes.  Genitourinary:  Negative for difficulty urinating and  frequency.   Musculoskeletal:  Negative for arthralgias.  Skin:  Negative for itching and rash.  Neurological:  Negative for extremity weakness.  Hematological:  Negative for adenopathy.  Psychiatric/Behavioral:  Negative for confusion.      PHYSICAL EXAMINATION: ECOG PERFORMANCE STATUS: 0 - Asymptomatic  Vitals:   10/20/23 1427  BP: (!) 117/91  Pulse: 82  Resp: 18  Temp: (!) 96 F (35.6 C)  SpO2: 100%   Filed Weights   10/20/23 1427  Weight: 199 lb 6.4 oz (90.4 kg)    Physical Exam Constitutional:      General: She is not in acute distress.    Appearance: She is not diaphoretic.  HENT:     Head: Normocephalic and atraumatic.  Eyes:     General: No scleral icterus. Cardiovascular:     Rate and Rhythm: Normal rate and regular rhythm.     Heart sounds: No murmur heard. Pulmonary:     Effort: Pulmonary effort is normal. No respiratory distress.  Abdominal:     General: There is no distension.     Palpations: Abdomen is soft.     Tenderness: There is no abdominal tenderness.  Musculoskeletal:        General: Normal range of motion.     Cervical back: Normal range of motion and neck supple.  Skin:    General: Skin is warm and dry.     Findings: No erythema.  Neurological:     Mental Status: She is alert and oriented to person, place, and time. Mental status is at baseline.     Cranial Nerves: No cranial nerve deficit.     Motor: No abnormal muscle tone.  Psychiatric:        Mood and Affect: Mood and affect normal.   Breast exam was performed in seated and lying down position. Patient is status post bilateral lumpectomy with a well-healed surgical scar.   Palpable right upper outer quadrant 10:00 breast mass. No palpable breast mass in left breast. No palpable axillary adenopathy bilaterally.   LABORATORY DATA:  I have reviewed the data as listed    Latest Ref Rng & Units 10/20/2023    2:16 PM 02/18/2023    9:39 AM 12/06/2022   12:38 PM  CBC  WBC 4.0 - 10.5  K/uL 4.4  9.7  6.0   Hemoglobin 12.0 - 15.0 g/dL 40.9  81.1  91.4   Hematocrit 36.0 - 46.0 % 36.6  37.4  40.1   Platelets 150 - 400 K/uL 255  302  328       Latest Ref Rng & Units 10/20/2023    2:16 PM 02/18/2023    9:39 AM 12/06/2022   12:38 PM  CMP  Glucose 70 - 99 mg/dL 782  93  94   BUN 6 - 20 mg/dL 8  17  11    Creatinine 0.44 - 1.00 mg/dL 9.56  2.13  0.86   Sodium 135 - 145 mmol/L 133  137  138   Potassium 3.5 - 5.1 mmol/L 4.0  3.5  3.8   Chloride 98 - 111 mmol/L 105  106  107   CO2 22 -  32 mmol/L 23  23  23    Calcium 8.9 - 10.3 mg/dL 9.1  9.4  9.6   Total Protein 6.5 - 8.1 g/dL 7.2  7.4    Total Bilirubin 0.0 - 1.2 mg/dL 0.7  0.4    Alkaline Phos 38 - 126 U/L 60  81    AST 15 - 41 U/L 22  25    ALT 0 - 44 U/L 31  57       RADIOGRAPHIC STUDIES: I have personally reviewed the radiological images as listed and agreed with the findings in the report. No results found.

## 2023-10-20 NOTE — Assessment & Plan Note (Signed)
 Likely due to Fibrocystic changes.  Recommend patient to decrease alcohol and caffeine intake.  Tamoxifen may help symptoms

## 2023-10-20 NOTE — Assessment & Plan Note (Addendum)
 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 Recommendation  Active Surveillance: life time annual  mammography  Continue Tamoxifen 20mg  daily.  Recommend Gyn evaluation once year.

## 2023-10-22 ENCOUNTER — Ambulatory Visit
Admission: RE | Admit: 2023-10-22 | Discharge: 2023-10-22 | Disposition: A | Discharge status: Home / Self Care | Source: Ambulatory Visit | Attending: Oncology | Admitting: Oncology

## 2023-10-22 ENCOUNTER — Ambulatory Visit
Admission: RE | Admit: 2023-10-22 | Discharge: 2023-10-22 | Discharge status: Home / Self Care | Source: Ambulatory Visit | Attending: Oncology

## 2023-10-22 DIAGNOSIS — N644 Mastodynia: Secondary | ICD-10-CM | POA: Diagnosis present

## 2023-10-22 DIAGNOSIS — N6310 Unspecified lump in the right breast, unspecified quadrant: Secondary | ICD-10-CM | POA: Diagnosis present

## 2023-10-22 DIAGNOSIS — N63 Unspecified lump in unspecified breast: Secondary | ICD-10-CM | POA: Insufficient documentation

## 2023-10-28 ENCOUNTER — Telehealth: Payer: Self-pay

## 2023-10-28 NOTE — Telephone Encounter (Signed)
-----   Message from Timmy Forbes sent at 10/27/2023  8:28 PM EDT ----- Please arrange her to have Bilateral diagnostic mammogram and ultrasound in November 2025.

## 2023-10-28 NOTE — Telephone Encounter (Signed)
 Orders already placed. [place] will reach out to pt to schedule.

## 2023-10-29 ENCOUNTER — Ambulatory Visit: Admitting: Obstetrics

## 2023-10-29 ENCOUNTER — Encounter: Payer: Self-pay | Admitting: Obstetrics

## 2023-10-29 ENCOUNTER — Other Ambulatory Visit (HOSPITAL_COMMUNITY)
Admission: RE | Admit: 2023-10-29 | Discharge: 2023-10-29 | Disposition: A | Discharge status: Home / Self Care | Source: Ambulatory Visit | Attending: Obstetrics | Admitting: Obstetrics

## 2023-10-29 VITALS — BP 120/85 | HR 94 | Ht 64.0 in | Wt 193.0 lb

## 2023-10-29 DIAGNOSIS — Z01419 Encounter for gynecological examination (general) (routine) without abnormal findings: Secondary | ICD-10-CM

## 2023-10-29 DIAGNOSIS — Z124 Encounter for screening for malignant neoplasm of cervix: Secondary | ICD-10-CM | POA: Diagnosis present

## 2023-10-29 DIAGNOSIS — N898 Other specified noninflammatory disorders of vagina: Secondary | ICD-10-CM | POA: Insufficient documentation

## 2023-10-29 DIAGNOSIS — Z131 Encounter for screening for diabetes mellitus: Secondary | ICD-10-CM

## 2023-10-29 DIAGNOSIS — Z13220 Encounter for screening for lipoid disorders: Secondary | ICD-10-CM

## 2023-10-29 DIAGNOSIS — Z23 Encounter for immunization: Secondary | ICD-10-CM | POA: Diagnosis not present

## 2023-10-29 DIAGNOSIS — Z113 Encounter for screening for infections with a predominantly sexual mode of transmission: Secondary | ICD-10-CM

## 2023-10-29 NOTE — Patient Instructions (Signed)

## 2023-10-29 NOTE — Progress Notes (Signed)
 GYNECOLOGY: ANNUAL EXAM   Subjective:    PCP: Patient, No Pcp Per Melody Hall is a 38 y.o. female [id number] who presents for annual wellness visit. PMH of atypical ductal hyperplasia of the left breast, follows with oncology and Q47mo mammograms, is on Tamoxifen.   Well Woman Visit:  GYN HISTORY:  No LMP recorded. (Menstrual status: Irregular Periods).     Menstrual History: OB History     Gravida  3   Para  2   Term  2   Preterm  0   AB  1   Living  2      SAB  0   IAB  0   Ectopic  0   Multiple  0   Live Births  2           Menarche age: 36 No LMP recorded. (Menstrual status: Irregular Periods).     Periods are every irregular  days, and last irregular days, flow is light / moderate / heavy.  Uses pads / tampons / menstrual cup and changes it every 3 hours.  Cramping is  moderate .  Cyclic symptoms include: bloating, breast tenderness, and weight gain.  Intermenstrual bleeding, spotting, or discharge? yes Urinary incontinence? no  Sexually active: no Number of sexual partners: 0 Gender of sexual Partners: male Social History   Substance and Sexual Activity  Sexual Activity Not Currently   Partners: Male   Contraceptive methods: no method Dyspareunia? no STI history: denies, requesting testing today STI/HIV testing or immunizations needed? Yes.     Health Maintenance: -Last pap: was normal 2023 --> Any abnormals: no -Last mammogram: 10/22/23 --> Any abnormals? yes -Last colon cancer screen: n/a / Type: n/a -Last DEXA scan: n/a -[facility] of Breast / Colon / Cervical cancer: no -Vaccines:  Immunization History  Administered Date(s) Administered   DTP 11/10/1990, 05/08/1992   DTaP 07/28/2014   HPV 9-valent 10/29/2023   Hepatitis B 04/26/1998, 05/31/1998, 10/04/1998, 05/03/1999, 06/14/1999, 10/15/1999   Influenza, Seasonal, Injecte, Preservative Fre 05/28/2005   Influenza-Unspecified 04/19/2020   MMR 05/08/1992, 12/20/2019   Moderna  Sars-Covid-2 Vaccination 12/20/2019, 01/19/2020   OPV 11/10/1990, 05/08/1992   Tdap 04/02/2013   Varicella 12/20/2019, 01/25/2020   Last Tdap: 2014 / Flu: n/a / COVID: utd / Gardasil: no / Shingles (50+): n/a / PCV20: n/a -Hep C screen: completed -Last lipid / glucose screening: today  > Exercise: not active > Dietary Supplements: Folate: No;  Calcium: No; Vitamin D: No > Body mass index is 33.13 kg/m.  > Recent dental visit Yes.   > Seat Belt Use: Yes.   > Texting and driving? No. > Guns in the house No. > Recreational or other drug use: denied.   Social History   Tobacco Use   Smoking status: Never    Passive exposure: Never   Smokeless tobacco: Never  Substance Use Topics   Alcohol use: Yes    Alcohol/week: 2.0 standard drinks of alcohol    Types: 2 Glasses of wine per week    Comment: occ  _________________________________________________________  Current Outpatient Medications  Medication Sig Dispense Refill   acetaminophen (TYLENOL) 325 MG tablet Take 650 mg by mouth every 6 (six) hours as needed.     tamoxifen (NOLVADEX) 20 MG tablet TAKE 1 TABLET BY MOUTH EVERY DAY 90 tablet 2   No current facility-administered medications for this visit.   No Known Allergies  Past Medical History:  Diagnosis Date   Anemia    2016  Breast lesion 07/31/2018   Chronic idiopathic urticaria    Depression    Elevated blood pressure reading    Fibroadenosis of breast    Hives    Hypertension    Mental disorder    Depression   Past Surgical History:  Procedure Laterality Date   BREAST BIOPSY Left 08/14/2022   Stereo Bx coil clip - neg   BREAST BIOPSY Left 08/14/2022   [place] LT BREAST BX W LOC DEV 1ST LESION IMG BX SPEC US GUIDE 08/14/2022 [facility]   BREAST BIOPSY Left 08/14/2022   MM LT BREAST BX W LOC DEV 1ST LESION IMAGE BX SPEC STEREO GUIDE 08/14/2022 [facility]   BREAST BIOPSY Right 08/28/2022   u/s bx, 1:00 RIBBON clip-FIBROADENOMA. -   BREAST BIOPSY  Left 08/28/2022   u/s bx, 9:00  RIBBON clip- FRAGMENTS OF BENIGN FIBROADENOMA,   BREAST BIOPSY Left 08/28/2022   [place] LT BREAST BX W LOC DEV 1ST LESION IMG BX SPEC US GUIDE 08/28/2022 [facility]   BREAST BIOPSY Right 08/28/2022   [place] RT BREAST BX W LOC DEV 1ST LESION IMG BX SPEC US GUIDE 08/28/2022 [facility]   BREAST LUMPECTOMY Right 04/28/2023   Procedure: BREAST LUMPECTOMY;  Surgeon: Campbell Lerner, MD;  Location: [facility];  Service: General;  Laterality: Right;   BREAST LUMPECTOMY WITH RADIOFREQUENCY TAG IDENTIFICATION Left 10/30/2022   RF tag for coil clip CSL   EXCISION OF BREAST BIOPSY Left 04/28/2023   Procedure: EXCISION OF BREAST BIOPSY, Old RF tag already placed;  Surgeon: Campbell Lerner, MD;  Location: [facility];  Service: General;  Laterality: Left;   SALIVARY GLAND SURGERY  2001   stone removed    Review Of Systems  Constitutional: Denied constitutional symptoms, night sweats, recent illness, fatigue, fever, insomnia and weight loss.  Eyes: Denied eye symptoms, eye pain, photophobia, vision change and visual disturbance.  Ears/Nose/Throat/Neck: Denied ear, nose, throat or neck symptoms, hearing loss, nasal discharge, sinus congestion and sore throat.  Cardiovascular: Denied cardiovascular symptoms, arrhythmia, chest pain/pressure, edema, exercise intolerance, orthopnea and palpitations.  Respiratory: Denied pulmonary symptoms, asthma, pleuritic pain, productive sputum, cough, dyspnea and wheezing.  Gastrointestinal: Denied, gastro-esophageal reflux, melena, nausea and vomiting.  Genitourinary: Denied genitourinary symptoms including symptomatic vaginal discharge, pelvic relaxation issues, and urinary complaints.  Musculoskeletal: Denied musculoskeletal symptoms, stiffness, swelling, muscle weakness and myalgia.  Dermatologic: Denied dermatology symptoms, rash and scar.  Neurologic: Denied neurology symptoms, dizziness, headache, neck pain and syncope.  Psychiatric:  Denied psychiatric symptoms, anxiety and depression.  Endocrine: Denied endocrine symptoms including hot flashes and night sweats.      Objective:    BP 120/85   Pulse 94   Ht 5\' 4"  (1.626 m)   Wt 193 lb (87.5 kg)   BMI 33.13 kg/m   Constitutional: Well-developed, well-nourished female in no acute distress Neurological: Alert and oriented to person, place, and time Psychiatric: Mood and affect appropriate Skin: No rashes or lesions Neck: Supple without masses. Trachea is midline.Thyroid is normal size without masses Lymphatics: No cervical, axillary, supraclavicular, or inguinal adenopathy noted Respiratory: Clear to auscultation bilaterally. Good air movement with normal work of breathing. Cardiovascular: Regular rate and rhythm. Extremities grossly normal, nontender with no edema; pulses regular Gastrointestinal: Soft, nontender, nondistended. No masses or hernias appreciated. No hepatosplenomegaly. No fluid wave. No rebound or guarding. Breast Exam: normal appearance, no masses or tenderness, No nipple retraction or dimpling, No nipple discharge or bleeding, No axillary or supraclavicular adenopathy, Normal to palpation without dominant masses Genitourinary:  External Genitalia: Normal female genitalia    Vagina: Normal mucosa, no lesions.    Cervix: No lesions, normal size and consistency; no cervical motion tenderness; non-friable; Pap obtained.    Uterus: Normal size and contour; smooth, mobile, NT. Adnexae: Non-palpable and non-tender Perineum/Anus: No lesions Rectal: deferred    Assessment/Plan:    Melody Hall is a 38 y.o. female [phone] with normal well-woman gynecologic exam.  -Screenings:  Pap: done with cotesting today Mammogram: PMH of ADH on Tamoxifen, managed by oncology Labs: A1C, Lipid panel, STI screen -Contraception: abstinence, declines -Vaccines: Gardasil discussed, #1 given today -Healthy lifestyle modifications discussed: multivitamin,  diet, exercise, sunscreen, tobacco and alcohol use. Emphasized importance of regular physical activity.  -Folate and Calcium/Vit D recommendation reviewed.  -All questions answered to patient's satisfaction.  -RTC 1 yr for annual, sooner prn.    Sofia Dunn, DO [facility]

## 2023-10-30 ENCOUNTER — Encounter: Payer: Self-pay | Admitting: Obstetrics

## 2023-10-30 LAB — HEP, RPR, HIV PANEL
HIV Screen 4th Generation wRfx: NONREACTIVE
Hepatitis B Surface Ag: NEGATIVE
RPR Ser Ql: NONREACTIVE

## 2023-10-30 LAB — LIPID PANEL
Chol/HDL Ratio: 2.8 ratio (ref 0.0–4.4)
Cholesterol, Total: 132 mg/dL (ref 100–199)
HDL: 48 mg/dL (ref >39)
LDL Chol Calc (NIH): 69 mg/dL (ref 0–99)
Triglycerides: 76 mg/dL (ref 0–149)
VLDL Cholesterol Cal: 15 mg/dL (ref 5–40)

## 2023-10-30 LAB — HEMOGLOBIN A1C
Est. average glucose Bld gHb Est-mCnc: 103 mg/dL
Hgb A1c MFr Bld: 5.2 % (ref 4.8–5.6)

## 2023-10-31 LAB — CERVICOVAGINAL ANCILLARY ONLY
Bacterial Vaginitis (gardnerella): NEGATIVE
Candida Glabrata: NEGATIVE
Candida Vaginitis: NEGATIVE
Chlamydia: NEGATIVE
Comment: NEGATIVE
Comment: NEGATIVE
Comment: NEGATIVE
Comment: NEGATIVE
Comment: NEGATIVE
Comment: NORMAL
Neisseria Gonorrhea: NEGATIVE
Trichomonas: NEGATIVE

## 2023-10-31 LAB — CYTOLOGY - PAP
Comment: NEGATIVE
Diagnosis: NEGATIVE
High risk HPV: NEGATIVE

## 2023-11-03 ENCOUNTER — Encounter: Payer: Self-pay | Admitting: Obstetrics

## 2023-11-06 ENCOUNTER — Ambulatory Visit: Admitting: Surgery

## 2023-11-12 DIAGNOSIS — N6092 Unspecified benign mammary dysplasia of left breast: Secondary | ICD-10-CM | POA: Diagnosis not present

## 2023-11-12 DIAGNOSIS — F332 Major depressive disorder, recurrent severe without psychotic features: Secondary | ICD-10-CM | POA: Diagnosis not present

## 2023-11-12 NOTE — Progress Notes (Signed)
 [facility] - Discharge Plan  Member has been discharged from [facility] Please see below Discharge Plan for details. The Member has been provided a copy of their Discharge Plan via the [facility] portal. If you have any questions, please contact our Clinical Team at [phone].  Name: Nolan, Sandefer Date of Birth: 06-01-1986 Discharge Date: 2023-11-12 Discharged Reason: Not Engaged Discharging to: Referring Specialist Referring Provider: Timmy Forbes, MD  Current Psychiatric Medications None, per Member self-report on Intake in February. Possible this is not up-to-date, but it did not sound like Member had a PCP or other prescribing physician.   Psychiatric Medication Recommendation Transition N/A (no CC med recs made)  Referrals: Recommended Referral: Psychiatry (Local / In-Person) Reason for Referral: Evaluation & Possible Medication Management  Member has Medicaid coverage. A couple in-network options for in-person psychiatric providers near [place] zip [phone] are listed below with links to PsychologyToday profiles. These providers are affiliated with [facility] in [place] phone [phone], website [email or web address]  Riccardo Chamberlain [email or web address]  Jerie Montana [email or web address]     Discharge Summary Care delivered: Member attended initial Coach Call on 08/12/2023 and [facility] Intake on 08/18/2023 and scheduled follow-up visits with both providers. Member no-showed and cancelled 2 subsequent Coach follow-ups and also 2 subsequent [facility] follow-ups and then became unresponsive to our team's outreach. Member expressed interest in healthier eating, alcohol intake reduction, weight loss, and time management strategies. She was also working on finding a job and [facility] was planning to support with problem-solving treatment on this front, as  well as motivational interviewing around alcohol use reduction/AA support and further info-gathering as outlined by clinical supervision regarding BH symptoms and history. Unfortunately Member became unresponsive to outreach before these interventions could be initiated.  Given initial [facility] intake assessment, it seems the patient does have significant support needs at this time and could benefit from any additional support available. [facility] will include LCSW at oncology clinic in discharge communication in case an in-person check-in with the patient could be arranged.   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 Wilhelmenia Harada @ [facility]. [facility] is open to med recs, however Dr. Wilhelmenia Harada doesn't accept recs and she does not have a PCP at this time.   Assessments:  Moniqua scored PHQ-9=18 (moderately severe), reporting anhedonia, hopelessness/purposelessness, hypersomnia, and passive suicidal ideation.  Safety Concerns: Christinamarie scored Low-Risk on the CSSR-S. She denied thinking of/wanting to hurt herself. She endorses passive SI involving thoughts of wanting to "get away from" life stressors tied to work, family, etc. No prior attempts reported.  Mania screening: Clyda reported a lifelong history of mood swings and periods of needing less sleep, with rapid switches between ups and downs, w/o sustained "up" periods beyond 2-3 days.  Shanique scored GAD-7=16 (severe), reporting anxiety around stressors like employment & custody battle/dealing with her kids' father. She considers herself an "over-thinker" by nature. Pooja additionally reported a sense of "paranoia", feeling like people are talking about her or out to get her.  Psych Tx/Hx: None reported.  Psychosocial Hx: Moranda lives with her paternal grandmother, though she would like to get her own place. She has 2 children, ages 1  and 70, and currently navigating custody with the father. 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

## 2023-11-13 ENCOUNTER — Ambulatory Visit: Admitting: Surgery

## 2023-11-27 ENCOUNTER — Ambulatory Visit: Admission: RE | Admit: 2023-11-27 | Payer: Medicaid Other | Source: Ambulatory Visit

## 2023-12-03 ENCOUNTER — Ambulatory Visit
Admission: RE | Admit: 2023-12-03 | Discharge: 2023-12-03 | Disposition: A | Discharge status: Home / Self Care | Source: Ambulatory Visit | Attending: Oncology | Admitting: Oncology

## 2023-12-03 DIAGNOSIS — N6092 Unspecified benign mammary dysplasia of left breast: Secondary | ICD-10-CM | POA: Insufficient documentation

## 2023-12-03 MED ORDER — GADOBUTROL 1 MMOL/ML IV SOLN
7.5000 mL | Freq: Once | INTRAVENOUS | Status: AC | PRN
  Administered 2023-12-03: 7.5 mL via INTRAVENOUS

## 2023-12-08 ENCOUNTER — Ambulatory Visit: Payer: Self-pay | Admitting: Oncology

## 2023-12-09 ENCOUNTER — Other Ambulatory Visit: Payer: Self-pay

## 2023-12-09 DIAGNOSIS — N6320 Unspecified lump in the left breast, unspecified quadrant: Secondary | ICD-10-CM

## 2023-12-09 DIAGNOSIS — Z9189 Other specified personal risk factors, not elsewhere classified: Secondary | ICD-10-CM

## 2023-12-09 NOTE — Telephone Encounter (Signed)
 Orders are in for bilateral diagnostic mammogram and left and right breast  US  for November. Breast center will schedule and notify patient.

## 2023-12-09 NOTE — Telephone Encounter (Signed)
-----   Message from Timmy Forbes sent at 12/08/2023  8:12 PM EDT ----- Please arrange Bilateral diagnostic mammogram and ultrasound in November of 2025

## 2023-12-29 ENCOUNTER — Ambulatory Visit

## 2024-02-27 ENCOUNTER — Ambulatory Visit

## 2024-05-11 ENCOUNTER — Encounter: Payer: Self-pay | Admitting: Nurse Practitioner

## 2024-05-11 ENCOUNTER — Ambulatory Visit

## 2024-05-11 DIAGNOSIS — B379 Candidiasis, unspecified: Secondary | ICD-10-CM

## 2024-05-11 DIAGNOSIS — Z113 Encounter for screening for infections with a predominantly sexual mode of transmission: Secondary | ICD-10-CM

## 2024-05-11 DIAGNOSIS — F4321 Adjustment disorder with depressed mood: Secondary | ICD-10-CM

## 2024-05-11 LAB — WET PREP FOR TRICH, YEAST, CLUE
Clue Cell Exam: NEGATIVE
Trichomonas Exam: NEGATIVE

## 2024-05-11 LAB — HM HIV SCREENING LAB: HM HIV Screening: NEGATIVE

## 2024-05-11 MED ORDER — FLUCONAZOLE 150 MG PO TABS: 150.0000 mg | ORAL_TABLET | Freq: Once | ORAL | Status: AC

## 2024-05-11 NOTE — Progress Notes (Signed)
 Pt is here for STD screening. Wet prep results reviewed with patient and was dispensed # 2 tablets of Diflucan Orally. Brochure and condoms given. Kwadwo Elasia Furnish,RN.

## 2024-05-11 NOTE — Progress Notes (Signed)
 [facility] Department STI clinic [address] [place] [address] Main phone: [phone]  STI screening visit  Subjective:  Melody Hall is a 39 y.o. female being seen today for an STI screening visit. The patient reports they do have symptoms.  Patient reports that they do not desire a pregnancy in the next year. Patient is currently using female condom to prevent pregnancy. They reported they are not interested in discussing contraception today.    No LMP recorded. (Menstrual status: Irregular Periods).  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 She denies abnormal discharge or lesions.  Does the patient using douching products? No  See flowsheet for further details and programmatic requirements Hyperlink available at the top of the signed note in blue.  Flow sheet content below:  Pregnancy Intention Screening Does the patient want to become pregnant in the next year?: No Does the patient's partner want to become pregnant in the next year?: No Would the patient like to discuss contraceptive options today?: No All  Patients Anyone smoke around pt and/or pt's children?: Yes Anyone smoke inside pt's house?: No Anyone smoke inside car?: Yes Anyone smoke inside the workplace?: No Reason For STD Screen STD Screening: Has symptoms Have you ever had an STD?: Yes History of Antibiotic use in the past 2 weeks?: No STD Symptoms Genital Itching: Yes Genital Itch s/s: 1 week Discharge: No Pain with sex: Yes Risk Factors for Hep B Household, sexual, or needle sharing contact of a person infected with Hep B: No Sexual contact with a person who uses drugs not as prescribed?: No Currently or Ever used drugs not as prescribed: No HIV Positive: No PRep Patient: No Men who have sex with men: No Have Hepatitis C: No History of Incarceration: No History of Homeslessness?: No Anal sex following anal drug use?: No Risk Factors for Hep C Currently using drugs not as prescribed: No Sexual partner(s) currently using drugs as not prescribed: No History of drug use: No HIV Positive: No People with a history of incarceration: No People born between the years of 30 and 78: No Abuse History Has patient ever been abused physically?: No Has patient ever been abused sexually?: No Does patient feel they have a problem with Anxiety?: No Does patient feel they have a problem with Depression?: Yes Counseling Patient counseled to use condoms with all sex: Condoms given RTC in 2-3 weeks for test results: Yes Clinic will call if test results abnormal before test result appt.: Yes Test results given to patient Patient counseled to use condoms with all sex: Condoms given   Screening for MPX risk:  Unexplained rash?  No   MSM?  No  Multiple or anonymous sex partners?  No   Any close or sexual contact with a person  diagnosed with MPX?  No   Any outside the [place]  where MPX is endemic?  No   High clinical suspicion for MPX?    -Unlikely to be chickenpox    -Lymphadenopathy    -Rash that presents in same phase of        evolution on any given body part  No   Screenings: Last HIV test per patient/review of record was  Lab Results  Component Value Date   HMHIVSCREEN Negative - Validated 08/26/2022    Lab Results  Component Value Date   HIV Non Reactive 10/29/2023     Last HEPC test per patient/review of record was  Lab Results  Component Value Date   HMHEPCSCREEN Negative-Validated 02/15/2022   No components found for: HEPC   Last HEPB test per patient/review of record was No components found for: HMHEPBSCREEN   Patient reports last pap was:   No results found for: SPECADGYN Result Date Procedure Results Follow-ups  10/29/2023 Cytology - PAP High risk HPV: Negative Adequacy: Satisfactory for evaluation; transformation zone component PRESENT. Diagnosis: - Negative for intraepithelial lesion or malignancy (NILM) Comment: Normal Reference Range HPV - Negative   07/31/2018 HM PAP SMEAR HM Pap smear: Negative, HPV Negative     Immunization history:  Immunization History  Administered Date(s) Administered   DTP 11/10/1990, 05/08/1992   DTaP 07/28/2014   HPV 9-valent 10/29/2023   Hepatitis B 04/26/1998, 05/31/1998, 10/04/1998, 05/03/1999, 06/14/1999, 10/15/1999   Influenza, Seasonal, Injecte, Preservative Fre 05/28/2005   Influenza-Unspecified 04/19/2020   MMR 05/08/1992, 12/20/2019   Moderna Sars-Covid-2 Vaccination 12/20/2019, 01/19/2020   OPV 11/10/1990, 05/08/1992   Tdap 04/02/2013   Varicella 12/20/2019, 01/25/2020    The following portions of the patient's history were reviewed and updated as appropriate: allergies, current medications, past medical history, past social history, past surgical history and problem list.  Objective:  There were no vitals filed for this visit.  Physical Exam Vitals and nursing note reviewed. Chaperone present: pt declines.  Constitutional:      Appearance: Normal appearance.  HENT:     Head: Normocephalic and atraumatic.     Mouth/Throat:      Mouth: Mucous membranes are moist.     Pharynx: Oropharynx is clear. No oropharyngeal exudate or posterior oropharyngeal erythema.  Pulmonary:     Effort: Pulmonary effort is normal.  Abdominal:     General: Abdomen is flat.     Palpations: There is no mass.     Tenderness: There is no abdominal tenderness. There is no rebound.  Genitourinary:    General: Normal vulva.     Exam position: Lithotomy position.     Pubic Area: No rash or pubic lice.      Labia:        Right: No rash or lesion.        Left: No rash or lesion.      Vagina: Vaginal discharge (thick curdled white), erythema and tenderness present. No bleeding or lesions.     Cervix: No cervical motion tenderness, discharge, friability, lesion or erythema.     Rectum: Normal.     Comments: pH less than 4 Lymphadenopathy:     Head:     Right side of head: No preauricular or posterior auricular adenopathy.     Left side of head: No preauricular or posterior auricular adenopathy.     Cervical: No cervical adenopathy.  Upper Body:     Right upper body: No supraclavicular, axillary or epitrochlear adenopathy.     Left upper body: No supraclavicular, axillary or epitrochlear adenopathy.     Lower Body: No right inguinal adenopathy. No left inguinal adenopathy.  Skin:    General: Skin is warm and dry.     Findings: No rash.  Neurological:     Mental Status: She is alert and oriented to person, place, and time.  Psychiatric:        Mood and Affect: Mood normal.        Behavior: Behavior normal.      Assessment and Plan:  Melody Hall is a 38 y.o. female presenting to the [facility] Department for STI screening  1. Screening for venereal disease (Primary)  - Syphilis Serology, [facility] - HIV [place] [facility] - Chlamydia/Gonorrhea [place] [facility] - WET PREP FOR TRICH, YEAST, CLUE  2. Grieving Long term partner died last year. Her sister also recently died. These were her two closest friends.  -  Ambulatory referral to Behavioral Health   3. Yeast infection WP: +yeast - fluconazole (DIFLUCAN) 150 MG tablet; Take 1 tablet (150 mg total) by mouth once for 1 dose.  Dispense: 2 tablet   Patient accepted the following screenings: vaginal CT/GC swab, vaginal wet prep, HIV, and RPR Patient meets criteria for HepB screening? No. Ordered? no Patient meets criteria for HepC screening? No. Ordered? no  Treat wet prep per standing order Discussed time line for [facility] results and that patient will be called with positive results and encouraged patient to call if she had not heard in 2 weeks.  Counseled to return or seek care for continued or worsening symptoms Recommended repeat testing in 3 months with positive results. Recommended condom use with all sex for STI prevention.   No follow-ups on file.  Future Appointments  Date Time Provider Department Center  06/04/2024  2:20 PM [facility] MM GV-DIAG 1 [facility]  06/04/2024  2:40 PM [facility] MM GV-US  1 [facility]  06/04/2024  2:50 PM [facility] MM GV-US  1 [facility]  06/14/2024  2:30 PM [facility] None  06/14/2024  2:45 PM Babara Call, MD [facility] None    Leita MARLA Bolognese, NP

## 2024-06-04 ENCOUNTER — Other Ambulatory Visit

## 2024-06-04 ENCOUNTER — Encounter

## 2024-06-14 ENCOUNTER — Inpatient Hospital Stay: Attending: Oncology

## 2024-06-14 ENCOUNTER — Inpatient Hospital Stay: Admitting: Oncology

## 2024-06-14 DIAGNOSIS — N6092 Unspecified benign mammary dysplasia of left breast: Secondary | ICD-10-CM | POA: Insufficient documentation

## 2024-06-14 DIAGNOSIS — N644 Mastodynia: Secondary | ICD-10-CM | POA: Insufficient documentation

## 2024-06-23 ENCOUNTER — Ambulatory Visit
Admission: RE | Admit: 2024-06-23 | Discharge: 2024-06-23 | Disposition: A | Discharge status: Home / Self Care | Source: Ambulatory Visit | Attending: Oncology | Admitting: Oncology

## 2024-06-23 DIAGNOSIS — N6320 Unspecified lump in the left breast, unspecified quadrant: Secondary | ICD-10-CM

## 2024-06-23 DIAGNOSIS — N6092 Unspecified benign mammary dysplasia of left breast: Secondary | ICD-10-CM | POA: Diagnosis present

## 2024-06-23 DIAGNOSIS — Z9189 Other specified personal risk factors, not elsewhere classified: Secondary | ICD-10-CM

## 2024-06-29 ENCOUNTER — Encounter: Payer: Self-pay | Admitting: Oncology

## 2024-06-29 ENCOUNTER — Telehealth: Payer: Self-pay | Admitting: Oncology

## 2024-06-29 ENCOUNTER — Inpatient Hospital Stay: Admitting: Oncology

## 2024-06-29 ENCOUNTER — Inpatient Hospital Stay

## 2024-06-29 VITALS — BP 113/89 | HR 91 | Temp 98.9°F | Resp 18 | Wt 187.0 lb

## 2024-06-29 DIAGNOSIS — N6092 Unspecified benign mammary dysplasia of left breast: Secondary | ICD-10-CM

## 2024-06-29 DIAGNOSIS — N644 Mastodynia: Secondary | ICD-10-CM

## 2024-06-29 DIAGNOSIS — Z9189 Other specified personal risk factors, not elsewhere classified: Secondary | ICD-10-CM | POA: Diagnosis not present

## 2024-06-29 LAB — CMP (CANCER CENTER ONLY)
ALT: 17 U/L (ref 0–44)
AST: 20 U/L (ref 15–41)
Albumin: 4.4 g/dL (ref 3.5–5.0)
Alkaline Phosphatase: 75 U/L (ref 38–126)
Anion gap: 11 (ref 5–15)
BUN: 11 mg/dL (ref 6–20)
CO2: 24 mmol/L (ref 22–32)
Calcium: 9.9 mg/dL (ref 8.9–10.3)
Chloride: 106 mmol/L (ref 98–111)
Creatinine: 0.81 mg/dL (ref 0.44–1.00)
GFR, Estimated: >60 mL/min (ref >60)
Glucose, Bld: 86 mg/dL (ref 70–99)
Potassium: 4.2 mmol/L (ref 3.5–5.1)
Sodium: 141 mmol/L (ref 135–145)
Total Bilirubin: 0.6 mg/dL (ref 0.0–1.2)
Total Protein: 7.1 g/dL (ref 6.5–8.1)

## 2024-06-29 LAB — CBC WITH DIFFERENTIAL (CANCER CENTER ONLY)
Abs Immature Granulocytes: 0.01 K/uL (ref 0.00–0.07)
Basophils Absolute: 0 K/uL (ref 0.0–0.1)
Basophils Relative: 1 %
Eosinophils Absolute: 0.3 K/uL (ref 0.0–0.5)
Eosinophils Relative: 6 %
HCT: 33.2 % — ABNORMAL LOW (ref 36.0–46.0)
Hemoglobin: 12 g/dL (ref 12.0–15.0)
Immature Granulocytes: 0 %
Lymphocytes Relative: 42 %
Lymphs Abs: 2.2 K/uL (ref 0.7–4.0)
MCH: 29.6 pg (ref 26.0–34.0)
MCHC: 36.1 g/dL — ABNORMAL HIGH (ref 30.0–36.0)
MCV: 82 fL (ref 80.0–100.0)
Monocytes Absolute: 0.5 K/uL (ref 0.1–1.0)
Monocytes Relative: 9 %
Neutro Abs: 2.3 K/uL (ref 1.7–7.7)
Neutrophils Relative %: 42 %
Platelet Count: 251 K/uL (ref 150–400)
RBC: 4.05 MIL/uL (ref 3.87–5.11)
RDW: 13.5 % (ref 11.5–15.5)
WBC Count: 5.3 K/uL (ref 4.0–10.5)
nRBC: 0 % (ref 0.0–0.2)

## 2024-06-29 MED ORDER — TAMOXIFEN CITRATE 20 MG PO TABS: 20.0000 mg | ORAL_TABLET | Freq: Every day | ORAL | 1 refills | Status: AC

## 2024-06-29 NOTE — Progress Notes (Signed)
 Hematology/Oncology Consult Note [phone] [phone]     REFERRING PROVIDER: No ref. provider found    CHIEF COMPLAINTS/PURPOSE OF CONSULTATION:  ADH  ASSESSMENT & PLAN:   Atypical ductal hyperplasia of left breast Atypical ductal hyperplasia is associated with a generalized, bilateral increase in breast cancer risk. Recommendation  Active Surveillance: life time annual  mammography  Continue Tamoxifen  20mg  daily.  Recommend Gyn evaluation annually  At increased risk of breast cancer Tyrer-Cuzick risk score is 36.7%. no pathological mutation on genetic testing.  Recommend annual breast mammogram with annual breast MRI Recent mammogram results reviewed and discussed with patient.  Will obtain 6 months follow-up of the left diagnostic mammogram and ultrasound for evaluation of left breast 3:00 mass.  Breast pain in female Likely due to Fibrocystic changes.  Recommend patient to decrease alcohol and caffeine intake.  Tamoxifen  may help symptoms   Orders Placed This Encounter  Procedures   MR BREAST BILATERAL W WO CONTRAST INC CAD    Standing Status:   Future    Expected Date:   11/24/2024    Expiration Date:   06/29/2025    If indicated for the ordered procedure, I authorize the administration of contrast media per Radiology protocol:   Yes    What is the patient's sedation requirement?:   No Sedation    Does the patient have a pacemaker or implanted devices?:   No    Radiology Contrast Protocol - do NOT remove file path:   [email or web address]    Preferred imaging location?:   [facility] (table limit - 500lbs)   MM 3D DIAGNOSTIC MAMMOGRAM UNILATERAL LEFT BREAST    Standing Status:   Future    Expected Date:   12/28/2024    Expiration Date:   06/29/2025    Reason for Exam (SYMPTOM  OR DIAGNOSIS REQUIRED):   adh of left breast; small lump @ 3 o'clock left breast    Is the patient pregnant?:   No     Preferred imaging location?:   [facility]   US  LIMITED ULTRASOUND INCLUDING AXILLA LEFT BREAST     Standing Status:   Future    Expected Date:   12/28/2024    Expiration Date:   06/29/2025    Reason for Exam (SYMPTOM  OR DIAGNOSIS REQUIRED):   adh Left breast; small lump @ 3 o'clock left breast    Preferred imaging location?:   [facility]   CMP (Cancer Center only)    Standing Status:   Future    Expected Date:   12/28/2024    Expiration Date:   03/28/2025   CBC with Differential (Cancer Center Only)    Standing Status:   Future    Expected Date:   12/28/2024    Expiration Date:   03/28/2025   Follow up in 6 months All questions were answered. The patient knows to call the clinic with any problems, questions or concerns.  Zelphia Cap, MD, PhD [facility] Hematology Oncology 06/29/2024    HISTORY OF PRESENTING ILLNESS:  Melody Hall 38 y.o. female presents to establish care for [facility]  06/21/2022  Bilateral diagnostic mammogram / US  showed left breast architectural distortion in the medial probably 3 o'clock, left breast 9 o'clock 4.4 x 1.3 x 2.9 cm oval mass, 3 o'clock 0.8 x  0.5 x 0.6 cm mass, 1o'clock  0.9 x 0.5 x 0.8 cm,  8 o'clock 1.1 x 0.6 x 1 cm oval hypoechoic mass. Right breast  1 o'clock 3.1 x 1.8  x 2.9 cm mass, 2 o'clock 2.4 x 0.9 x 1.6 cm mass, 10 o'clock 3.8 x 2.4 x 3.8 cm macro lobulated hypoechoic mass. Bilateral axilla is negative   08/14/22 She underwent biopsy  A. BREAST, LEFT AT 3:00, 4 CM FROM THE NIPPLE; ULTRASOUND-GUIDED CORE  NEEDLE BIOPSY:  - FRAGMENTS OF BENIGN FIBROADENOMA, WITH ASSOCIATED DYSTROPHIC  CALCIFICATIONS.  - NEGATIVE FOR ATYPICAL PROLIFERATIVE BREAST DISEASE.   B. BREAST, LEFT UPPER INNER QUADRANT; STEREOTACTIC CORE NEEDLE BIOPSY:  - FLAT EPITHELIAL ATYPIA.  - BACKGROUND CHANGES SUGGESTIVE OF COMPLEX SCLEROSING LESION/RADIAL  SCAR, WITH ASSOCIATED COLUMNAR CELL CHANGE.  - NEGATIVE FOR CARCINOMA IN SITU AND MALIGNANCY.    08/28/22 She  underwent biopsy  A. BREAST, RIGHT AT 1:00, 8 CM FROM THE NIPPLE; ULTRASOUND-GUIDED CORE  NEEDLE BIOPSY:  - FRAGMENTS OF BENIGN FIBROADENOMA.  - NEGATIVE FOR ATYPICAL PROLIFERATIVE BREAST DISEASE.   B. BREAST, LEFT AT 9:00, 3 CM FROM THE NIPPLE; ULTRASOUND-GUIDED CORE  NEEDLE BIOPSY:  - FRAGMENTS OF BENIGN FIBROADENOMA, WITH FOCAL USUAL DUCTAL HYPERPLASIA.  - NEGATIVE FOR ATYPICAL PROLIFERATIVE BREAST DISEASE.   04/28/2023 she underwent bilateral breast lumpectomy   1. Breast, lumpectomy, Left upper inner quadrant :       - FOCAL ATYPICAL DUCTAL HYPERPLASIA AND FLAT EPITHELIAL ATYPIA INVOLVING A       COMPLEX SCLEROSING LESION; MARGINS ARE NEGATIVE FOR ATYPIA.       - COLUMNAR CELL CHANGE, ADENOSIS, AND FIBROADENOMATOID CHANGE.       - BIOPSY SITE CHANGE WITH CLIP.       - SCOUT TAG PRESENT.        2. Breast, lumpectomy, Right upper inner quadrant :       - COMPLEX FIBROADENOMA.       - NEGATIVE FOR ATYPIA AND MALIGNANC   Menarche at age of 38yo First live birth at age of 38yo OCP use: Dep[facility] for 5-10 years History of HRT use: No Denies family history of breast cancer  patient has had genetic testing and was negative for pathological mutation. Tyrer-Cuzick risk score is 36.7%.    INTERVAL HISTORY Melody Hall is a 38 y.o. female who has above history reviewed by me today presents for follow up visit for atypical ductal hyperplasia.  At risk of breast risk. Patient reports bilateral breast pain, especially at night.,  Intermittent.  Patient drinks alcohol intermittently. No new complaints.  She tolerates tamoxifen  well.  MEDICAL HISTORY:  Past Medical History:  Diagnosis Date   Anemia    2016   Breast lesion 07/31/2018   Chronic idiopathic urticaria    Depression    Elevated blood pressure reading    Fibroadenosis of breast    Hives    Hypertension    Mental disorder    Depression    SURGICAL HISTORY: Past Surgical History:  Procedure Laterality Date   BREAST  BIOPSY Left 08/14/2022   Stereo Bx coil clip - neg   BREAST BIOPSY Left 08/14/2022   US  LT BREAST BX W LOC DEV 1ST LESION IMG BX SPEC US  GUIDE 08/14/2022 [facility]   BREAST BIOPSY Left 08/14/2022   MM LT BREAST BX W LOC DEV 1ST LESION IMAGE BX SPEC STEREO GUIDE 08/14/2022 [facility]   BREAST BIOPSY Right 08/28/2022   u/s bx, 1:00 RIBBON clip-FIBROADENOMA. -   BREAST BIOPSY Left 08/28/2022   u/s bx, 9:00  RIBBON clip- FRAGMENTS OF BENIGN FIBROADENOMA,   BREAST BIOPSY Left 08/28/2022   US  LT BREAST BX W LOC DEV 1ST LESION IMG BX  SPEC US  GUIDE 08/28/2022 [facility]   BREAST BIOPSY Right 08/28/2022   US  RT BREAST BX W LOC DEV 1ST LESION IMG BX SPEC US  GUIDE 08/28/2022 [facility]   BREAST LUMPECTOMY Right 04/28/2023   Procedure: BREAST LUMPECTOMY;  Surgeon: Lane Shope, MD;  Location: [facility];  Service: General;  Laterality: Right;   BREAST LUMPECTOMY WITH RADIOFREQUENCY TAG IDENTIFICATION Left 10/30/2022   RF tag for coil clip CSL   EXCISION OF BREAST BIOPSY Left 04/28/2023   Procedure: EXCISION OF BREAST BIOPSY, Old RF tag already placed;  Surgeon: Lane Shope, MD;  Location: [facility];  Service: General;  Laterality: Left;   SALIVARY GLAND SURGERY  2001   stone removed    SOCIAL HISTORY: Social History   Socioeconomic History   Marital status: Significant Other    Spouse name: Selinda Hopping   Number of children: 2   Years of education: 12   Highest education level: High school graduate  Occupational History   Occupation: Not working  Tobacco Use   Smoking status: Never    Passive exposure: Never   Smokeless tobacco: Never  Vaping Use   Vaping status: Never Used  Substance and Sexual Activity   Alcohol use: Yes    Alcohol/week: 2.0 standard drinks of alcohol    Types: 2 Glasses of wine per week    Comment: occ   Drug use: Not Currently    Types: Marijuana   Sexual activity: Not Currently    Partners: Male  Other Topics Concern   Not on  file  Social History Narrative   ** Merged History Encounter **       Social Drivers of Health   Tobacco Use: Low Risk (06/29/2024)   Patient History    Smoking Tobacco Use: Never    Smokeless Tobacco Use: Never    Passive Exposure: Never  Financial Resource Strain: Low Risk (05/19/2023)   Overall Financial Resource Strain (CARDIA)    Difficulty of Paying Living Expenses: Not very hard  Food Insecurity: No Food Insecurity (05/19/2023)   Hunger Vital Sign    Worried About Running Out of Food in the Last Year: Never true    Ran Out of Food in the Last Year: Never true  Transportation Needs: Not at Risk (12/23/2022)   Received from [employer] Needs    Transportation: 1  Physical Activity: Not on File (12/06/2022)   Received from [facility]   Physical Activity    Physical Activity: 0  Stress: Not on File (12/06/2022)   Received from [facility]   Stress    Stress: 0  Social Connections: Not on File (03/18/2023)   Received from [facility]   Social Connections    Connectedness: 0  Intimate Partner Violence: Unknown (05/23/2022)   Received from [facility]   HITS    Physically Hurt: Not on file    Insult or Talk Down To: Not on file    Threaten Physical Harm: Not on file    Scream or Curse: Not on file  Depression (PHQ2-9): Low Risk (02/15/2022)   Depression (PHQ2-9)    PHQ-2 Score: 1  Alcohol Screen: Not on file  Housing: Low Risk (05/19/2023)   Housing    Last Housing Risk Score: 0  Utilities: Not At Risk (05/19/2023)   [facility] Utilities    Threatened with loss of utilities: No  Health Literacy: Adequate Health Literacy (05/19/2023)   B1300 Health Literacy    Frequency of need for help with medical instructions: Never  FAMILY HISTORY: Family History  Problem Relation Age of Onset   Healthy Paternal Grandfather    Healthy Paternal Grandmother    Healthy Maternal Grandmother    Healthy Maternal Grandfather    Healthy Father    Healthy Mother     ALLERGIES:  has no known  allergies.  MEDICATIONS:  Current Outpatient Medications  Medication Sig Dispense Refill   acetaminophen  (TYLENOL ) 325 MG tablet Take 650 mg by mouth every 6 (six) hours as needed.     tamoxifen  (NOLVADEX ) 20 MG tablet Take 1 tablet (20 mg total) by mouth daily. 100 tablet 1   No current facility-administered medications for this visit.    Review of Systems  Constitutional:  Negative for appetite change, chills, fatigue and fever.  HENT:   Negative for hearing loss and voice change.   Eyes:  Negative for eye problems.  Respiratory:  Negative for chest tightness and cough.   Cardiovascular:  Negative for chest pain.  Gastrointestinal:  Negative for abdominal distention, abdominal pain and blood in stool.  Endocrine: Negative for hot flashes.  Genitourinary:  Negative for difficulty urinating and frequency.   Musculoskeletal:  Negative for arthralgias.  Skin:  Negative for itching and rash.  Neurological:  Negative for extremity weakness.  Hematological:  Negative for adenopathy.  Psychiatric/Behavioral:  Negative for confusion.      PHYSICAL EXAMINATION: ECOG PERFORMANCE STATUS: 0 - Asymptomatic  Vitals:   06/29/24 1457  BP: 113/89  Pulse: 91  Resp: 18  Temp: 98.9 F (37.2 C)  SpO2: 96%   Filed Weights   06/29/24 1457  Weight: 187 lb (84.8 kg)    Physical Exam Constitutional:      General: She is not in acute distress.    Appearance: She is not diaphoretic.  HENT:     Head: Normocephalic and atraumatic.  Eyes:     General: No scleral icterus. Cardiovascular:     Rate and Rhythm: Normal rate and regular rhythm.     Heart sounds: No murmur heard. Pulmonary:     Effort: Pulmonary effort is normal. No respiratory distress.  Abdominal:     General: There is no distension.     Palpations: Abdomen is soft.     Tenderness: There is no abdominal tenderness.  Musculoskeletal:        General: Normal range of motion.     Cervical back: Normal range of motion and  neck supple.  Skin:    General: Skin is warm and dry.     Findings: No erythema.  Neurological:     Mental Status: She is alert and oriented to person, place, and time. Mental status is at baseline.     Cranial Nerves: No cranial nerve deficit.     Motor: No abnormal muscle tone.  Psychiatric:        Mood and Affect: Mood and affect normal.   Breast exam was performed in seated and lying down position. Patient is status post bilateral lumpectomy with a well-healed surgical scar.   Palpable right upper outer quadrant 10:00 breast mass. No palpable breast mass in left breast. No palpable axillary adenopathy bilaterally.   LABORATORY DATA:  I have reviewed the data as listed    Latest Ref Rng & Units 06/29/2024    2:49 PM 10/20/2023    2:16 PM 02/18/2023    9:39 AM  CBC  WBC 4.0 - 10.5 K/uL 5.3  4.4  9.7   Hemoglobin 12.0 - 15.0 g/dL 87.9  13.5  13.2   Hematocrit 36.0 - 46.0 % 33.2  36.6  37.4   Platelets 150 - 400 K/uL 251  255  302       Latest Ref Rng & Units 06/29/2024    2:48 PM 10/20/2023    2:16 PM 02/18/2023    9:39 AM  CMP  Glucose 70 - 99 mg/dL 86  874  93   BUN 6 - 20 mg/dL 11  8  17    Creatinine 0.44 - 1.00 mg/dL 9.18  9.41  9.24   Sodium 135 - 145 mmol/L 141  133  137   Potassium 3.5 - 5.1 mmol/L 4.2  4.0  3.5   Chloride 98 - 111 mmol/L 106  105  106   CO2 22 - 32 mmol/L 24  23  23    Calcium 8.9 - 10.3 mg/dL 9.9  9.1  9.4   Total Protein 6.5 - 8.1 g/dL 7.1  7.2  7.4   Total Bilirubin 0.0 - 1.2 mg/dL 0.6  0.7  0.4   Alkaline Phos 38 - 126 U/L 75  60  81   AST 15 - 41 U/L 20  22  25    ALT 0 - 44 U/L 17  31  57      RADIOGRAPHIC STUDIES: I have personally reviewed the radiological images as listed and agreed with the findings in the report. MM 3D DIAGNOSTIC MAMMOGRAM BILATERAL BREAST Result Date: 06/23/2024 CLINICAL DATA:  High-risk patient. Patient with history of excisional biopsy of the LEFT breast for complex sclerosing lesion with associated ADH. Patient  with history of RIGHT breast excisional biopsy for a complex fibroadenoma. Patient also with history of ultrasound-guided core biopsy of a mass in the LEFT breast at 3 o'clock 4 cm from the nipple showing a fibroadenoma, a LEFT breast mass at 9 o'clock 3 cm from the nipple showing a fibroadenoma. Additional probably benign masses in both breasts which have been followed since December of 2023. Patient on tamoxifen . EXAM: DIGITAL DIAGNOSTIC BILATERAL MAMMOGRAM WITH TOMOSYNTHESIS AND CAD; ULTRASOUND LEFT BREAST LIMITED; ULTRASOUND RIGHT BREAST LIMITED TECHNIQUE: Bilateral digital diagnostic mammography and breast tomosynthesis was performed. The images were evaluated with computer-aided detection. ; Targeted ultrasound examination of the left breast was performed.; Targeted ultrasound examination of the right breast was performed COMPARISON:  Previous exam(s). ACR Breast Density Category b: There are scattered areas of fibroglandular density. FINDINGS: Stable postsurgical changes of the BILATERAL breasts. Benign biopsy clips are present in the LEFT breast, corresponding with known fibroadenomas. BILATERAL round and oval circumscribed masses, including dominant RIGHT breast masses at 10 o'clock and 2 o'clock, previously biopsied dominant mass in the anterior LEFT breast, the LEFT breast masses at 1 o'clock and 8 o'clock, and numerous additional BILATERAL smaller round and oval circumscribed masses. At the LEFT breast 3 o'clock position, just posterior to the previously biopsied partially calcified fibroadenoma containing a heart shaped biopsy clip is a 6 mm oval circumscribed mass which is favored to be slightly increased in size since the time of the patient's baseline mammogram in 2023, at which time it measured approximately 4 mm in diameter. Ultrasound of this area was performed. Targeted ultrasound of the RIGHT breast demonstrates a 20 x 8 x 14 mm oval circumscribed mass in the RIGHT breast at 2 o'clock 10 cm from  the nipple. This previously measured 24 x 9 x 16 mm in December of 2023. At the RIGHT breast 10 o'clock position 10 cm from the nipple there is a 26  x 16 x 23 mm oval circumscribed hypoechoic mass. This previously measured 38 x 22 x 37 mm in December of 2023. At the LEFT breast 8 o'clock position in the RETROAREOLAR breast there is a 10 x 6 x 9 mm oval circumscribed hypoechoic mass. This previously measured 11 x 6 x 10 mm in December of 2023. At the LEFT breast 1 o'clock position 2 cm from the nipple there is an 8 x 6 x 7 mm oval circumscribed hypoechoic mass, which previously measured 9 x 5 x 8 mm in December of 2023. The LATERAL LEFT breast was evaluated in the region surrounding the previously biopsied, partially calcified fibroadenoma at 3 o'clock 4 cm from the nipple. Adjacent to this mass at the 3 o'clock position 5 cm from the nipple is a 4 x 2 x 3 mm oval circumscribed hypoechoic mass. A second oval circumscribed hypoechoic mass is noted at 3 o'clock 7 cm from the nipple, measuring 7 x 2 x 5 mm. One of these masses likely correlates with the mammographic finding. They have not been previously imaged on ultrasound. IMPRESSION: 1. Oval circumscribed mass in the LEFT breast at 3 o'clock which has very slightly increased in size mammographically since 2023, although is not substantially changed in size from 2024. This likely correlates with 1 of 2 oval circumscribed, probably benign masses seen in this region on targeted ultrasound today. The management of probably benign masses was discussed with the patient, to include imaging surveillance or image guided biopsy. Given history of multiple biopsy-proven fibroadenomas, minimal change over time, and lack of suspicious abnormalities in this region of the breast on targeted ultrasound, short-term imaging follow-up in 6 months is reasonable in this high risk patient. The patient opts for imaging surveillance at this time. 2. Additional BILATERAL masses previously  described as probably benign demonstrate greater than 2 years of stability or decrease in size on tamoxifen . They are considered benign and do not require specific further imaging follow-up. 3.  No mammographic evidence of malignancy in the RIGHT breast. RECOMMENDATION: Six-month follow-up LEFT breast mammogram and ultrasound. The patient will also be due for her high-risk screening MRI in May of 2026. I have discussed the findings and recommendations with the patient. If applicable, a reminder letter will be sent to the patient regarding the next appointment. BI-RADS CATEGORY  3: Probably benign. Electronically Signed   By: Norleen Croak M.D.   On: 06/23/2024 18:52   US  LIMITED ULTRASOUND INCLUDING AXILLA LEFT BREAST  Result Date: 06/23/2024 CLINICAL DATA:  High-risk patient. Patient with history of excisional biopsy of the LEFT breast for complex sclerosing lesion with associated ADH. Patient with history of RIGHT breast excisional biopsy for a complex fibroadenoma. Patient also with history of ultrasound-guided core biopsy of a mass in the LEFT breast at 3 o'clock 4 cm from the nipple showing a fibroadenoma, a LEFT breast mass at 9 o'clock 3 cm from the nipple showing a fibroadenoma. Additional probably benign masses in both breasts which have been followed since December of 2023. Patient on tamoxifen . EXAM: DIGITAL DIAGNOSTIC BILATERAL MAMMOGRAM WITH TOMOSYNTHESIS AND CAD; ULTRASOUND LEFT BREAST LIMITED; ULTRASOUND RIGHT BREAST LIMITED TECHNIQUE: Bilateral digital diagnostic mammography and breast tomosynthesis was performed. The images were evaluated with computer-aided detection. ; Targeted ultrasound examination of the left breast was performed.; Targeted ultrasound examination of the right breast was performed COMPARISON:  Previous exam(s). ACR Breast Density Category b: There are scattered areas of fibroglandular density. FINDINGS: Stable postsurgical changes of the BILATERAL  breasts. Benign biopsy clips  are present in the LEFT breast, corresponding with known fibroadenomas. BILATERAL round and oval circumscribed masses, including dominant RIGHT breast masses at 10 o'clock and 2 o'clock, previously biopsied dominant mass in the anterior LEFT breast, the LEFT breast masses at 1 o'clock and 8 o'clock, and numerous additional BILATERAL smaller round and oval circumscribed masses. At the LEFT breast 3 o'clock position, just posterior to the previously biopsied partially calcified fibroadenoma containing a heart shaped biopsy clip is a 6 mm oval circumscribed mass which is favored to be slightly increased in size since the time of the patient's baseline mammogram in 2023, at which time it measured approximately 4 mm in diameter. Ultrasound of this area was performed. Targeted ultrasound of the RIGHT breast demonstrates a 20 x 8 x 14 mm oval circumscribed mass in the RIGHT breast at 2 o'clock 10 cm from the nipple. This previously measured 24 x 9 x 16 mm in December of 2023. At the RIGHT breast 10 o'clock position 10 cm from the nipple there is a 26 x 16 x 23 mm oval circumscribed hypoechoic mass. This previously measured 38 x 22 x 37 mm in December of 2023. At the LEFT breast 8 o'clock position in the RETROAREOLAR breast there is a 10 x 6 x 9 mm oval circumscribed hypoechoic mass. This previously measured 11 x 6 x 10 mm in December of 2023. At the LEFT breast 1 o'clock position 2 cm from the nipple there is an 8 x 6 x 7 mm oval circumscribed hypoechoic mass, which previously measured 9 x 5 x 8 mm in December of 2023. The LATERAL LEFT breast was evaluated in the region surrounding the previously biopsied, partially calcified fibroadenoma at 3 o'clock 4 cm from the nipple. Adjacent to this mass at the 3 o'clock position 5 cm from the nipple is a 4 x 2 x 3 mm oval circumscribed hypoechoic mass. A second oval circumscribed hypoechoic mass is noted at 3 o'clock 7 cm from the nipple, measuring 7 x 2 x 5 mm. One of these masses  likely correlates with the mammographic finding. They have not been previously imaged on ultrasound. IMPRESSION: 1. Oval circumscribed mass in the LEFT breast at 3 o'clock which has very slightly increased in size mammographically since 2023, although is not substantially changed in size from 2024. This likely correlates with 1 of 2 oval circumscribed, probably benign masses seen in this region on targeted ultrasound today. The management of probably benign masses was discussed with the patient, to include imaging surveillance or image guided biopsy. Given history of multiple biopsy-proven fibroadenomas, minimal change over time, and lack of suspicious abnormalities in this region of the breast on targeted ultrasound, short-term imaging follow-up in 6 months is reasonable in this high risk patient. The patient opts for imaging surveillance at this time. 2. Additional BILATERAL masses previously described as probably benign demonstrate greater than 2 years of stability or decrease in size on tamoxifen . They are considered benign and do not require specific further imaging follow-up. 3.  No mammographic evidence of malignancy in the RIGHT breast. RECOMMENDATION: Six-month follow-up LEFT breast mammogram and ultrasound. The patient will also be due for her high-risk screening MRI in May of 2026. I have discussed the findings and recommendations with the patient. If applicable, a reminder letter will be sent to the patient regarding the next appointment. BI-RADS CATEGORY  3: Probably benign. Electronically Signed   By: Norleen Croak M.D.   On: 06/23/2024  18:52   US  LIMITED ULTRASOUND INCLUDING AXILLA RIGHT BREAST Result Date: 06/23/2024 CLINICAL DATA:  High-risk patient. Patient with history of excisional biopsy of the LEFT breast for complex sclerosing lesion with associated ADH. Patient with history of RIGHT breast excisional biopsy for a complex fibroadenoma. Patient also with history of ultrasound-guided core  biopsy of a mass in the LEFT breast at 3 o'clock 4 cm from the nipple showing a fibroadenoma, a LEFT breast mass at 9 o'clock 3 cm from the nipple showing a fibroadenoma. Additional probably benign masses in both breasts which have been followed since December of 2023. Patient on tamoxifen . EXAM: DIGITAL DIAGNOSTIC BILATERAL MAMMOGRAM WITH TOMOSYNTHESIS AND CAD; ULTRASOUND LEFT BREAST LIMITED; ULTRASOUND RIGHT BREAST LIMITED TECHNIQUE: Bilateral digital diagnostic mammography and breast tomosynthesis was performed. The images were evaluated with computer-aided detection. ; Targeted ultrasound examination of the left breast was performed.; Targeted ultrasound examination of the right breast was performed COMPARISON:  Previous exam(s). ACR Breast Density Category b: There are scattered areas of fibroglandular density. FINDINGS: Stable postsurgical changes of the BILATERAL breasts. Benign biopsy clips are present in the LEFT breast, corresponding with known fibroadenomas. BILATERAL round and oval circumscribed masses, including dominant RIGHT breast masses at 10 o'clock and 2 o'clock, previously biopsied dominant mass in the anterior LEFT breast, the LEFT breast masses at 1 o'clock and 8 o'clock, and numerous additional BILATERAL smaller round and oval circumscribed masses. At the LEFT breast 3 o'clock position, just posterior to the previously biopsied partially calcified fibroadenoma containing a heart shaped biopsy clip is a 6 mm oval circumscribed mass which is favored to be slightly increased in size since the time of the patient's baseline mammogram in 2023, at which time it measured approximately 4 mm in diameter. Ultrasound of this area was performed. Targeted ultrasound of the RIGHT breast demonstrates a 20 x 8 x 14 mm oval circumscribed mass in the RIGHT breast at 2 o'clock 10 cm from the nipple. This previously measured 24 x 9 x 16 mm in December of 2023. At the RIGHT breast 10 o'clock position 10 cm from  the nipple there is a 26 x 16 x 23 mm oval circumscribed hypoechoic mass. This previously measured 38 x 22 x 37 mm in December of 2023. At the LEFT breast 8 o'clock position in the RETROAREOLAR breast there is a 10 x 6 x 9 mm oval circumscribed hypoechoic mass. This previously measured 11 x 6 x 10 mm in December of 2023. At the LEFT breast 1 o'clock position 2 cm from the nipple there is an 8 x 6 x 7 mm oval circumscribed hypoechoic mass, which previously measured 9 x 5 x 8 mm in December of 2023. The LATERAL LEFT breast was evaluated in the region surrounding the previously biopsied, partially calcified fibroadenoma at 3 o'clock 4 cm from the nipple. Adjacent to this mass at the 3 o'clock position 5 cm from the nipple is a 4 x 2 x 3 mm oval circumscribed hypoechoic mass. A second oval circumscribed hypoechoic mass is noted at 3 o'clock 7 cm from the nipple, measuring 7 x 2 x 5 mm. One of these masses likely correlates with the mammographic finding. They have not been previously imaged on ultrasound. IMPRESSION: 1. Oval circumscribed mass in the LEFT breast at 3 o'clock which has very slightly increased in size mammographically since 2023, although is not substantially changed in size from 2024. This likely correlates with 1 of 2 oval circumscribed, probably benign masses seen in this  region on targeted ultrasound today. The management of probably benign masses was discussed with the patient, to include imaging surveillance or image guided biopsy. Given history of multiple biopsy-proven fibroadenomas, minimal change over time, and lack of suspicious abnormalities in this region of the breast on targeted ultrasound, short-term imaging follow-up in 6 months is reasonable in this high risk patient. The patient opts for imaging surveillance at this time. 2. Additional BILATERAL masses previously described as probably benign demonstrate greater than 2 years of stability or decrease in size on tamoxifen . They are  considered benign and do not require specific further imaging follow-up. 3.  No mammographic evidence of malignancy in the RIGHT breast. RECOMMENDATION: Six-month follow-up LEFT breast mammogram and ultrasound. The patient will also be due for her high-risk screening MRI in May of 2026. I have discussed the findings and recommendations with the patient. If applicable, a reminder letter will be sent to the patient regarding the next appointment. BI-RADS CATEGORY  3: Probably benign. Electronically Signed   By: Norleen Croak M.D.   On: 06/23/2024 18:52

## 2024-06-29 NOTE — Assessment & Plan Note (Addendum)
 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 Recommendation  Active Surveillance: life time annual  mammography  Continue Tamoxifen  20mg  daily.  Recommend Gyn evaluation annually

## 2024-06-29 NOTE — Assessment & Plan Note (Signed)
 Likely due to Fibrocystic changes.  Recommend patient to decrease alcohol and caffeine intake.  Tamoxifen may help symptoms

## 2024-06-29 NOTE — Telephone Encounter (Signed)
 Pt to get mammo/US  in 6 months. I called [facility] but they closed at 2:30pm today for a staff meeting. They will re-open tomorrow for normal business hours. I explained this to pt and told her she can call [facility] in the morning to get her appt scheduled. All of the other appts (breast MRI and MD follow up) have been scheduled and AVS handed to pt while she was here in clinic.

## 2024-06-29 NOTE — Patient Instructions (Signed)
 Please Call [facility] at [phone] to schedule the Left breast mammogram and ultrasound.

## 2024-06-29 NOTE — Telephone Encounter (Signed)
 Pt called to let us  know that she is running behind, but is still planning on coming to appt today - LH

## 2024-06-29 NOTE — Assessment & Plan Note (Signed)
 Tyrer-Cuzick risk score is 36.7%. no pathological mutation on genetic testing.  Recommend annual breast mammogram with annual breast MRI Recent mammogram results reviewed and discussed with patient.  Will obtain 6 months follow-up of the left diagnostic mammogram and ultrasound for evaluation of left breast 3:00 mass.

## 2024-11-24 ENCOUNTER — Other Ambulatory Visit

## 2024-12-28 ENCOUNTER — Encounter

## 2024-12-28 ENCOUNTER — Other Ambulatory Visit

## 2025-01-04 ENCOUNTER — Inpatient Hospital Stay: Admitting: Oncology

## 2025-01-04 ENCOUNTER — Inpatient Hospital Stay
